# Patient Record
Sex: Male | Born: 1970 | Race: Black or African American | Hispanic: No | Smoking: Former smoker
Health system: Southern US, Community
[De-identification: ages and names within clinical notes are randomized; demographics above are authoritative.]

## PROBLEM LIST (undated history)

## (undated) DIAGNOSIS — G459 Transient cerebral ischemic attack, unspecified: Secondary | ICD-10-CM

## (undated) DIAGNOSIS — E7800 Pure hypercholesterolemia, unspecified: Secondary | ICD-10-CM

## (undated) DIAGNOSIS — I10 Essential (primary) hypertension: Secondary | ICD-10-CM

## (undated) DIAGNOSIS — I517 Cardiomegaly: Secondary | ICD-10-CM

## (undated) DIAGNOSIS — I639 Cerebral infarction, unspecified: Secondary | ICD-10-CM

## (undated) HISTORY — DX: Cerebral infarction, unspecified: I63.9

## (undated) HISTORY — DX: Essential (primary) hypertension: I10

## (undated) HISTORY — PX: NO PAST SURGERIES: SHX2092

## (undated) HISTORY — DX: Cardiomegaly: I51.7

---

## 1998-02-22 ENCOUNTER — Observation Stay (HOSPITAL_COMMUNITY): Admission: EM | Admit: 1998-02-22 | Discharge: 1998-02-22 | Payer: Self-pay | Admitting: Emergency Medicine

## 2000-01-22 ENCOUNTER — Emergency Department (HOSPITAL_COMMUNITY): Admission: EM | Admit: 2000-01-22 | Discharge: 2000-01-22 | Payer: Self-pay | Admitting: Emergency Medicine

## 2002-07-03 ENCOUNTER — Emergency Department (HOSPITAL_COMMUNITY): Admission: EM | Admit: 2002-07-03 | Discharge: 2002-07-04 | Payer: Self-pay | Admitting: Emergency Medicine

## 2002-07-03 ENCOUNTER — Encounter: Payer: Self-pay | Admitting: Emergency Medicine

## 2003-10-22 ENCOUNTER — Emergency Department (HOSPITAL_COMMUNITY): Admission: EM | Admit: 2003-10-22 | Discharge: 2003-10-22 | Payer: Self-pay | Admitting: Emergency Medicine

## 2006-01-08 ENCOUNTER — Emergency Department (HOSPITAL_COMMUNITY): Admission: EM | Admit: 2006-01-08 | Discharge: 2006-01-08 | Payer: Self-pay | Admitting: Emergency Medicine

## 2008-04-08 ENCOUNTER — Emergency Department (HOSPITAL_COMMUNITY): Admission: EM | Admit: 2008-04-08 | Discharge: 2008-04-08 | Payer: Self-pay | Admitting: Emergency Medicine

## 2008-04-11 ENCOUNTER — Ambulatory Visit: Payer: Self-pay | Admitting: Infectious Diseases

## 2008-04-11 ENCOUNTER — Ambulatory Visit: Payer: Self-pay | Admitting: Vascular Surgery

## 2008-04-11 ENCOUNTER — Inpatient Hospital Stay (HOSPITAL_COMMUNITY): Admission: EM | Admit: 2008-04-11 | Discharge: 2008-04-13 | Payer: Self-pay | Admitting: Emergency Medicine

## 2008-04-11 ENCOUNTER — Encounter: Payer: Self-pay | Admitting: Infectious Diseases

## 2008-04-12 DIAGNOSIS — I639 Cerebral infarction, unspecified: Secondary | ICD-10-CM

## 2008-04-12 HISTORY — DX: Cerebral infarction, unspecified: I63.9

## 2008-04-25 ENCOUNTER — Encounter (INDEPENDENT_AMBULATORY_CARE_PROVIDER_SITE_OTHER): Payer: Self-pay | Admitting: Internal Medicine

## 2008-05-02 ENCOUNTER — Encounter (INDEPENDENT_AMBULATORY_CARE_PROVIDER_SITE_OTHER): Payer: Self-pay | Admitting: Internal Medicine

## 2008-05-02 ENCOUNTER — Ambulatory Visit: Payer: Self-pay | Admitting: Internal Medicine

## 2008-05-02 DIAGNOSIS — Z8679 Personal history of other diseases of the circulatory system: Secondary | ICD-10-CM | POA: Insufficient documentation

## 2008-05-02 DIAGNOSIS — I10 Essential (primary) hypertension: Secondary | ICD-10-CM | POA: Insufficient documentation

## 2008-05-04 ENCOUNTER — Ambulatory Visit (HOSPITAL_COMMUNITY): Admission: RE | Admit: 2008-05-04 | Discharge: 2008-05-04 | Payer: Self-pay | Admitting: Cardiovascular Disease

## 2008-05-04 ENCOUNTER — Encounter (INDEPENDENT_AMBULATORY_CARE_PROVIDER_SITE_OTHER): Payer: Self-pay | Admitting: Cardiovascular Disease

## 2008-06-06 ENCOUNTER — Ambulatory Visit: Payer: Self-pay | Admitting: Infectious Diseases

## 2008-06-06 ENCOUNTER — Encounter (INDEPENDENT_AMBULATORY_CARE_PROVIDER_SITE_OTHER): Payer: Self-pay | Admitting: Internal Medicine

## 2008-06-06 LAB — CONVERTED CEMR LAB
Chloride: 106 meq/L (ref 96–112)
Glucose, Bld: 99 mg/dL (ref 70–99)
Potassium: 4.4 meq/L (ref 3.5–5.3)
Sodium: 139 meq/L (ref 135–145)

## 2008-06-20 ENCOUNTER — Encounter (INDEPENDENT_AMBULATORY_CARE_PROVIDER_SITE_OTHER): Payer: Self-pay | Admitting: *Deleted

## 2008-06-20 ENCOUNTER — Ambulatory Visit: Payer: Self-pay | Admitting: Internal Medicine

## 2008-06-20 DIAGNOSIS — F17200 Nicotine dependence, unspecified, uncomplicated: Secondary | ICD-10-CM | POA: Insufficient documentation

## 2008-06-20 DIAGNOSIS — F411 Generalized anxiety disorder: Secondary | ICD-10-CM | POA: Insufficient documentation

## 2008-06-20 LAB — CONVERTED CEMR LAB
CO2: 25 meq/L (ref 19–32)
Calcium: 9.2 mg/dL (ref 8.4–10.5)
Glucose, Bld: 84 mg/dL (ref 70–99)
Sodium: 141 meq/L (ref 135–145)

## 2008-08-15 ENCOUNTER — Telehealth (INDEPENDENT_AMBULATORY_CARE_PROVIDER_SITE_OTHER): Payer: Self-pay | Admitting: Internal Medicine

## 2008-10-09 ENCOUNTER — Telehealth (INDEPENDENT_AMBULATORY_CARE_PROVIDER_SITE_OTHER): Payer: Self-pay | Admitting: Internal Medicine

## 2008-11-05 ENCOUNTER — Telehealth (INDEPENDENT_AMBULATORY_CARE_PROVIDER_SITE_OTHER): Payer: Self-pay | Admitting: Internal Medicine

## 2008-11-09 ENCOUNTER — Ambulatory Visit: Payer: Self-pay | Admitting: Internal Medicine

## 2008-11-09 ENCOUNTER — Ambulatory Visit (HOSPITAL_COMMUNITY): Admission: RE | Admit: 2008-11-09 | Discharge: 2008-11-09 | Payer: Self-pay | Admitting: Internal Medicine

## 2008-11-09 ENCOUNTER — Encounter (INDEPENDENT_AMBULATORY_CARE_PROVIDER_SITE_OTHER): Payer: Self-pay | Admitting: Internal Medicine

## 2008-11-12 LAB — CONVERTED CEMR LAB
AST: 21 units/L (ref 0–37)
Albumin: 4 g/dL (ref 3.5–5.2)
Alkaline Phosphatase: 79 units/L (ref 39–117)
CO2: 27 meq/L (ref 19–32)
Chloride: 106 meq/L (ref 96–112)
Creatinine, Ser: 1.33 mg/dL (ref 0.40–1.50)
GFR calc non Af Amer: 60 mL/min — ABNORMAL LOW (ref >60)
HCT: 42.8 % (ref 39.0–52.0)
HDL: 42 mg/dL (ref >39)
Hemoglobin: 14.9 g/dL (ref 13.0–17.0)
LDL Cholesterol: 134 mg/dL — ABNORMAL HIGH (ref 0–99)
MCV: 87.2 fL (ref 78.0–100.0)
RBC: 4.91 M/uL (ref 4.22–5.81)
Relative Index: 1 (ref 0.0–2.5)
Sodium: 139 meq/L (ref 135–145)
TSH: 1.844 microintl units/mL (ref 0.350–4.500)
Total Bilirubin: 0.6 mg/dL (ref 0.3–1.2)
Total CHOL/HDL Ratio: 4.7
Troponin I: 0.02 ng/mL (ref <0.06)
VLDL: 23 mg/dL (ref 0–40)
WBC: 5.2 10*3/uL (ref 4.0–10.5)

## 2008-11-13 ENCOUNTER — Telehealth (INDEPENDENT_AMBULATORY_CARE_PROVIDER_SITE_OTHER): Payer: Self-pay | Admitting: Internal Medicine

## 2008-11-27 ENCOUNTER — Telehealth (INDEPENDENT_AMBULATORY_CARE_PROVIDER_SITE_OTHER): Payer: Self-pay | Admitting: Internal Medicine

## 2008-12-20 ENCOUNTER — Ambulatory Visit: Payer: Self-pay | Admitting: Internal Medicine

## 2009-02-26 ENCOUNTER — Telehealth: Payer: Self-pay | Admitting: Internal Medicine

## 2009-04-25 ENCOUNTER — Telehealth: Payer: Self-pay | Admitting: Internal Medicine

## 2009-04-30 ENCOUNTER — Ambulatory Visit: Payer: Self-pay | Admitting: Internal Medicine

## 2009-04-30 DIAGNOSIS — E785 Hyperlipidemia, unspecified: Secondary | ICD-10-CM | POA: Insufficient documentation

## 2009-07-09 ENCOUNTER — Telehealth (INDEPENDENT_AMBULATORY_CARE_PROVIDER_SITE_OTHER): Payer: Self-pay | Admitting: *Deleted

## 2009-08-01 ENCOUNTER — Telehealth: Payer: Self-pay | Admitting: Internal Medicine

## 2009-08-28 ENCOUNTER — Telehealth (INDEPENDENT_AMBULATORY_CARE_PROVIDER_SITE_OTHER): Payer: Self-pay | Admitting: *Deleted

## 2009-09-26 ENCOUNTER — Telehealth (INDEPENDENT_AMBULATORY_CARE_PROVIDER_SITE_OTHER): Payer: Self-pay | Admitting: *Deleted

## 2009-10-08 ENCOUNTER — Ambulatory Visit: Payer: Self-pay | Admitting: Infectious Diseases

## 2009-10-19 DIAGNOSIS — D239 Other benign neoplasm of skin, unspecified: Secondary | ICD-10-CM | POA: Insufficient documentation

## 2009-11-22 ENCOUNTER — Emergency Department (HOSPITAL_COMMUNITY): Admission: EM | Admit: 2009-11-22 | Discharge: 2009-11-22 | Payer: Self-pay | Admitting: Emergency Medicine

## 2009-11-22 ENCOUNTER — Encounter: Payer: Self-pay | Admitting: Internal Medicine

## 2009-11-22 DIAGNOSIS — E876 Hypokalemia: Secondary | ICD-10-CM | POA: Insufficient documentation

## 2009-11-25 ENCOUNTER — Telehealth: Payer: Self-pay | Admitting: *Deleted

## 2009-11-25 ENCOUNTER — Ambulatory Visit: Payer: Self-pay | Admitting: Infectious Disease

## 2009-11-25 DIAGNOSIS — R42 Dizziness and giddiness: Secondary | ICD-10-CM

## 2009-11-25 LAB — CONVERTED CEMR LAB
BUN: 19 mg/dL (ref 6–23)
Calcium: 9.7 mg/dL (ref 8.4–10.5)
Chloride: 107 meq/L (ref 96–112)
Creatinine, Ser: 1.22 mg/dL (ref 0.40–1.50)

## 2010-08-04 ENCOUNTER — Telehealth (INDEPENDENT_AMBULATORY_CARE_PROVIDER_SITE_OTHER): Payer: Self-pay | Admitting: *Deleted

## 2010-08-12 NOTE — Progress Notes (Signed)
Summary: med refill/gp  Phone Note Refill Request Message from:  Fax from Pharmacy on August 01, 2009 10:20 AM  Refills Requested: Medication #1:  LISINOPRIL 20 MG TABS Take 2 tablet by mouth once a day   Last Refilled: 07/02/2009 Last appt. 04/30/09; last CMP 11/09/08.   Method Requested: Electronic Initial call taken by: Chinita Pester RN,  August 01, 2009 10:30 AM  Follow-up for Phone Call       Follow-up by: Blanch Media MD,  August 01, 2009 10:39 AM    Prescriptions: LISINOPRIL 20 MG TABS (LISINOPRIL) Take 2 tablet by mouth once a day  #62 x 11   Entered and Authorized by:   Blanch Media MD   Signed by:   Blanch Media MD on 08/01/2009   Method used:   Electronically to        [facility] [phone]* (retail)       [address]       [place], [place]  [address]       Ph: [phone]       Fax: [phone]   RxID:   [id number]

## 2010-08-12 NOTE — Miscellaneous (Signed)
Summary: ED discharge Note  ED Discharge Note  Glenn Newton was seen in the ED on 11/22/2009 at 5 AM in the morning where he prsented with concerns of dizziness. Patient said that his head was spinning as he was trying to get out of bed. This is 2nd episode of dizziness within last 4 weeks. The dizziness was not associated with headache, chest pain, shortness of breath or palpitations. patient has been compliant with his meds. He reports that his BP at home recently has been as low as 101/60's and he feels that it is low for quite some time. In the ED the initial vitals were s/o BP of 93/60 at bedside monitor which came up to 126/78 by the time time he got his 1Lt bolus NS. Patient was asymptomatic when during out interview with him.  Vitals: Temp 98.1 BP 126/77 P 70 RR 18 O2 sat 97% on RA  Gen: AOx3, in no acute distress Eyes: PERRL, EOMI, slight erythema noted b/l ENT:MMM, No erythema noted in posterior pharynx Neck: No JVD, No LAP Chest: CTAB with  good respiratory effort CVS: regular rhythmic rate, NO M/R/G, S1 S2 normal Abdo: soft,ND, BS+x4, Non tender and No hepatosplenomegaly EXT: No odema noted Neuro: Non focal Skin: no rashes noted.   Labs: WBC                                      6.5               4.0-10.5         K/uL  RBC                                      4.74              4.22-5.81        MIL/uL  Hemoglobin (HGB)                         14.5              13.0-17.0        g/dL  Hematocrit (HCT)                         42.3              39.0-52.0        %  MCV                                      89.4              78.0-100.0       fL  MCHC                                     34.3              30.0-36.0        g/dL  RDW  13.6              11.5-15.5        %  Platelet Count (PLT)                     234               150-400          K/uL  Neutrophils, %                           37         l      43-77            %  Lymphocytes, %                            45                12-46            %  Monocytes, %                             11                3-12             %  Eosinophils, %                           5                 0-5              %  Basophils, %                             2          h      0-1              %  Neutrophils, Absolute                    2.4               1.7-7.7          K/uL  Lymphocytes, Absolute                    2.9               0.7-4.0          K/uL  Monocytes, Absolute                      0.7               0.1-1.0          K/uL  Eosinophils, Absolute                    0.4               0.0-0.7          K/uL  Basophils, Absolute                      0.1  0.0-0.1          K/uL Sodium (NA)                              141               135-145          mEq/L  Potassium (K)                            3.1        l      3.5-5.1          mEq/L  Chloride                                 101               96-112           mEq/L  CO2                                      29                19-32            mEq/L  Glucose                                  111        h      70-99            mg/dL  BUN                                      7                 6-23             mg/dL  Creatinine                               1.13              0.4-1.5          mg/dL  GFR, Est Non African American            >60               >60              mL/min  GFR, Est African American                >60               >60              mL/min    Oversized comment, see footnote  1  Bilirubin, Total                         1.0               0.3-1.2          mg/dL  Alkaline Phosphatase                     64                39-117           U/L  SGOT (AST)                               23                0-37             U/L  SGPT (ALT)                               18                0-53             U/L  Total  Protein                           7.9               6.0-8.3          g/dL  Albumin-Blood                             4.3               3.5-5.2          g/dL  Calcium                                  9.5               8.4-10.5         mg/dL  I-STAT-Comment                           SEE NOTE.    Oversized comment, see footnote  1  CKMB, POC                                <1.0       l      1.0-8.0          ng/mL  Troponin I, POC                          <0.05             0.00-0.09        ng/mL  Myoglobin, POC                           67.9              12-200           ng/mL  Imaging CXR IMPRESSION:   No active cardiopulmonary disease.  EKG: Normal sinus rhythm  Plan:  Dizzyness: Given his recent h/o hypotension with his BP being in 90's/60's in ED today, we strongly feel that patient needs to readjust his BP meds. We asked him to stop  taking Norvasc and HCTZ for now. We will arrange him to be seen in the clinic today as an urgent walk in and have his BP meds adjusted.  hypokalemia: This is most likley 2/2 HCTZ, he already recieved total dose of 60 MEq in ED and we may repeat his Bmet while he is in the clinic today. Also we should consider stopping HCTZ in him due to side effects.

## 2010-08-12 NOTE — Progress Notes (Signed)
Summary: Appointment  Phone Note Outgoing Call   Call placed by: Angelina Ok RN,  Nov 25, 2009 9:53 AM Call placed to: Patient Summary of Call: Call to pt to schedule him an appointment in the Clinics this pm.  Call to pt at another number message left for pt to call the Clinics.  Pt has been scheduled for an appointment for this pm to followup with an apppoointment today.Angelina Ok RN  Nov 25, 2009 9:45 AM

## 2010-08-12 NOTE — Assessment & Plan Note (Signed)
Summary: hypertension recheck/gg   Vital Signs:  Patient profile:   40 year old male Height:      65 inches (165.10 cm) Weight:      215.8 pounds (98.09 kg) BMI:     36.04 Temp:     98.9 degrees F (37.17 degrees C) oral Pulse rate:   77 / minute BP sitting:   115 / 79  (right arm)  Vitals Entered By: Stanton Kidney Ditzler RN (October 08, 2009 1:54 PM) Is Patient Diabetic? No Pain Assessment Patient in pain? no      Nutritional Status BMI of > 30 = obese Nutritional Status Detail appetite good  Have you ever been in a relationship where you felt threatened, hurt or afraid?denies   Does patient need assistance? Functional Status Self care Ambulation Normal Comments Ck-up and refills on meds.   Primary Care Provider:  Marinda Elk MD   History of Present Illness: Patient reports good compliance with medications. He did report that he had to wait a week to get simvastatin and norvasc when he called for refills in the past. He also reports that he is still smoking, but is actively trying to quit. He denies any other active compliants.  Notably, he was also concerned about what appears to be a nevus on his nose. He states that there have been no changes in color or irregularity. He asked about the potential for removal given the location of the mole and comments made by other regarding his need to remove it for aesthetic purposes. He comments that the nevus has been the same way for years without any changes.  Depression History:      The patient denies a depressed mood most of the day and a diminished interest in his usual daily activities.         Preventive Screening-Counseling & Management  Alcohol-Tobacco     Smoking Status: current     Smoking Cessation Counseling: yes     Packs/Day: 1/2 ppd     Year Started: STARTED BACK  NOV. 09     Year Quit: Sept. 28, 2009  Caffeine-Diet-Exercise     Does Patient Exercise: no     Type of exercise: WALKING     Times/week:  2-3  Current Problems (verified): 1)  Hyperlipidemia  (ICD-272.4) 2)  Tobacco Abuse  (ICD-305.1) 3)  Anxiety  (ICD-300.00) 4)  Hypertension  (ICD-401.9) 5)  Cerebrovascular Accident, Hx of  (ICD-V12.50)  Current Medications (verified): 1)  Adprin B 325 Mg Tabs (Aspirin Buf(Cacarb-Mgcarb-Mgo)) .... Take 1 Tablet By Mouth Once A Day 2)  Hydrochlorothiazide 25 Mg Tabs (Hydrochlorothiazide) .... Take 1 Tablet By Mouth Once A Day 3)  Coreg 6.25 Mg Tabs (Carvedilol) .... Take 1 Tablet By Mouth Two Times A Day 4)  Simvastatin 40 Mg Tabs (Simvastatin) .... Take 1 Tablet By Mouth Once A Day 5)  Lisinopril 20 Mg Tabs (Lisinopril) .... Take 2 Tablet By Mouth Once A Day 6)  Norvasc 10 Mg Tabs (Amlodipine Besylate) .... 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 Hypertension, severe left ventricular hypertrophy.  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 Married but happily separated 6 kids with 2 from previous relationship and 4 with wife. Smokes about 1 pack per week.  Risk Factors: Exercise: no (10/08/2009)  Risk Factors: Smoking Status: current (10/08/2009) Packs/Day: 1/2 ppd (10/08/2009)  Family History: Reviewed history from 05/02/2008 and no changes required. Family History of Stroke F 1st degree relative <60  Social History: Reviewed history from 04/30/2009 and no changes required. Occupation: Drawing unemployment currently for 2 years. Used to work in [employer]. High school education. Married but  happily separated 6 kids with 2 from previous relationship and 4 with wife. Smokes about 1 pack per week.Packs/Day:  1/2 ppd  Review of Systems      See HPI  Physical Exam  General:  alert, well-developed, well-nourished, and well-hydrated.   Head:  normocephalic and atraumatic.   Eyes:  vision grossly intact.   Ears:  no external deformities.   Nose:  no external deformity, no external erythema, and no nasal discharge. Darkly pigmented nevus noted on the tip of nose on the right side. Circular without irregular edges. Uniform color and consistency of the nevus. Appears to be a common, benign nevus. Mouth:  pharynx pink and moist.   Neck:  supple, full ROM, and no carotid bruits.   Lungs:  normal respiratory effort and normal breath sounds.   Heart:  normal rate, regular rhythm, no murmur, no gallop, and no rub.   Abdomen:  soft and non-tender.   Msk:  normal ROM.   Pulses:  R radial normal and L radial normal.   Neurologic:  alert & oriented X3, cranial nerves II-XII intact, and strength normal in all extremities. Patient reports sensation deficit over 2st digit on R hand which is stable. Skin:  turgor normal, color normal, and no suspicious lesions. Has a well-circumscribed, darkly pigmented nevus on the R side of the distal tp of his nose. See HEENT exam. Psych:  Oriented X3, memory intact for recent and remote, normally interactive, good eye contact, not anxious appearing, and not depressed appearing.     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 On aspirin, lipid lowering medication and several anti-hypertensive medications.  Problem # 2:  TOBACCO ABUSE (ICD-305.1) At 1/2 ppd still. Encouraged cessation, especially given medical history. Being unemployed is making it difficult for the patient to quit.  Problem # 3:  HYPERTENSION (ICD-401.9) BP at goal.  His  updated medication list for this problem includes:    Hydrochlorothiazide 25 Mg Tabs (Hydrochlorothiazide) .Marland Kitchen... Take 1 tablet by mouth once a day    Coreg 6.25 Mg Tabs (Carvedilol) .Marland Kitchen... Take 1 tablet by mouth two times a day    Lisinopril 20 Mg Tabs (Lisinopril) .Marland Kitchen... Take 2 tablet by mouth once a day    Norvasc 10 Mg Tabs (Amlodipine besylate) .Marland Kitchen... Take 1 tablet by mouth once a day  Future Orders: T-Comprehensive Metabolic Panel ([id number]) ... 10/09/2009 T-CBC w/Diff ([id number]) ... 10/09/2009  BP today: 115/79 Prior BP: 137/95 (04/30/2009)  Prior 10 Yr Risk Heart Disease: 11 % (04/30/2009)  Labs Reviewed: K+: 4.3 (11/09/2008) Creat: : 1.33 (11/09/2008)   Chol: 199 (11/09/2008)   HDL: 42 (11/09/2008)   LDL: 134 (11/09/2008)   TG: 113 (11/09/2008)  Problem # 4:  Preventive Health Care (ICD-V70.0) Getting tetanus shot.  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 Given that the removal would be difficult to perform unless a skilled practicioner with experience was the provider, recommended that the patient see a dermatologist to inquire further about removal. Did mention that depending on that practicioner's diagnosis and feeling regarding the need for removal, it may be considered elective and would need to be paid for out of pocket. Patient inquired regarding cost and no accurate estimate could be given. Told patient that a referral to dermatology could be made if he so desired and he deferred referral at this time. Will continue to monitor for any changes that would be concerning and discussed with patient regarding color or size changes and irregularity of the boundaries of the lesion.  Complete Medication List: 1)  Adprin B 325 Mg Tabs (Aspirin buf(cacarb-mgcarb-mgo)) .... Take 1 tablet by mouth once a day 2)  Hydrochlorothiazide 25 Mg Tabs  (Hydrochlorothiazide) .... Take 1 tablet by mouth once a day 3)  Coreg 6.25 Mg Tabs (Carvedilol) .... Take 1 tablet by mouth two times a day 4)  Simvastatin 40 Mg Tabs (Simvastatin) .... Take 1 tablet by mouth once a day 5)  Lisinopril 20 Mg Tabs (Lisinopril) .... Take 2 tablet by mouth once a day 6)  Norvasc 10 Mg Tabs (Amlodipine besylate) .... Take 1 tablet by mouth once a day  Other Orders: Tdap => 17yrs IM ([address]) Admin 1st Vaccine ([address]454) Future Orders: T-Lipid Profile ([id number]) ... 10/09/2009  Patient Instructions: 1)  Please schedule a follow-up appointment in 4 months. 2)  Tobacco is very bad for your health and your loved ones! You Should stop smoking!. 3)  It is important that you exercise regularly at least 20 minutes 5 times a week. If you develop chest pain, have severe difficulty breathing, or feel very tired , stop exercising immediately and seek medical attention. 4)  Take an Aspirin every day. 5)  Please return for a FASTING Lipid Profile one (1) week before your next appointment as scheduled. Prescriptions: NORVASC 10 MG TABS (AMLODIPINE BESYLATE) Take 1 tablet by mouth once a day  #60 x 3   Entered and Authorized by:   Nilda Riggs MD   Signed by:   Nilda Riggs MD on 10/08/2009   Method used:   Electronically to        [facility] [phone]* (retail)       [address]       [place], [place]  [address]       Ph: [phone]       Fax: [phone]   RxID:   [id number] COREG 6.25 MG TABS (CARVEDILOL) Take 1 tablet by mouth two times a day  #120 x 3   Entered and Authorized by:   Nilda Riggs MD   Signed by:   Nilda Riggs MD on 10/08/2009   Method used:   Electronically to        [facility] [phone]* (retail)       [address]       [place], [place]  [address]       Ph: [phone]       Fax: [phone]   RxID:   [id number] HYDROCHLOROTHIAZIDE 25 MG TABS (HYDROCHLOROTHIAZIDE) Take 1 tablet by mouth once a day  #60 x 3    Entered and Authorized by:   Nilda Riggs MD   Signed by:   Nilda Riggs MD on 10/08/2009   Method used:   Electronically to  [facility] [address] [phone]* (retail)       [address]       [place], [place]  [address]       Ph: [phone]       Fax: [phone]   RxID:   [id number]   Prevention & Chronic Care Immunizations   Influenza vaccine: Not documented   Influenza vaccine deferral: Refused  (04/30/2009)    Tetanus booster: 10/08/2009: Tdap    Pneumococcal vaccine: Not documented  Other Screening   Smoking status: current  (10/08/2009)   Smoking cessation counseling: yes  (10/08/2009)  Lipids   Total Cholesterol: 199  (11/09/2008)   LDL: 134  (11/09/2008)   LDL Direct: Not documented   HDL: 42  (11/09/2008)   Triglycerides: 113  (11/09/2008)    SGOT (AST): 21  (11/09/2008)   SGPT (ALT): 17  (11/09/2008) CMP ordered    Alkaline phosphatase: 79  (11/09/2008)   Total bilirubin: 0.6  (11/09/2008)    Lipid flowsheet reviewed?: Yes   Progress toward LDL goal: Unchanged  Hypertension   Last Blood Pressure: 115 / 79  (10/08/2009)   Serum creatinine: 1.33  (11/09/2008)   Serum potassium 4.3  (11/09/2008) CMP ordered     Hypertension flowsheet reviewed?: Yes   Progress toward BP goal: At goal  Self-Management Support :   Personal Goals (by the next clinic visit) :      Personal blood pressure goal: 140/90  (04/30/2009)   Patient will work on the following items until the next clinic visit to reach self-care goals:     Medications and monitoring: take my medicines every day, check my blood pressure, bring all of my medications to every visit, weigh myself weekly  (10/08/2009)     Eating: drink diet soda or water instead of juice or soda, eat more vegetables, use fresh or frozen vegetables, eat foods that are low in salt, eat fruit for snacks and desserts  (10/08/2009)     Activity: take a 30 minute walk every day  (10/08/2009)     Other: wants to "get  back to walking" to decrease weight   (04/30/2009)    Hypertension self-management support: Written self-care plan, Education handout  (10/08/2009)   Hypertension self-care plan printed.   Hypertension education handout printed    Lipid self-management support: Written self-care plan, Education handout  (10/08/2009)   Lipid self-care plan printed.   Lipid education handout printed      Resource handout printed.   Nursing Instructions: Give tetanus booster today   Process Orders Check Orders Results:     [facility]: ABN not required for this insurance Tests Sent for requisitioning (October 19, 2009 4:35 PM):     10/09/2009: [facility] -- T-Comprehensive Metabolic Panel [[id number]] (signed)     10/09/2009: [facility] -- T-Lipid Profile [id number] (signed)     10/09/2009: [facility] -- [facility] w/Diff [[id number]] (signed)     Immunizations Administered:  Tetanus Vaccine:    Vaccine Type: Tdap    Site: right deltoid    Mfr: Boostrix    Dose: 0.5 ml    Route: IM    Given by: Dorie Rank RN    Exp. Date: 10/05/2011    Lot #: [id number]    VIS given: 05/31/07 version given October 08, 2009.

## 2010-08-12 NOTE — Progress Notes (Signed)
Summary: med refill/gp  Phone Note Refill Request Message from:  Fax from Pharmacy on September 26, 2009 11:20 AM  Refills Requested: Medication #1:  NORVASC 10 MG TABS Take 1 tablet by mouth once a day.   Last Refilled: 08/28/2009 Last appt. 04/30/09.   Last CMP 11/09/08.   Method Requested: Electronic Initial call taken by: Chinita Pester RN,  September 26, 2009 11:20 AM  Follow-up for Phone Call        At last visit, Dr Logan Bores requested that pt return in one month.  Will refill 2 months worth and make pt F/U appt so can recheck BP and BMP as was last checked in April 2010. Follow-up by: Blanch Media MD,  September 30, 2009 9:34 AM  Additional Follow-up for Phone Call Additional follow up Details #1::        Flag sent to [facility] for an appt. Additional Follow-up by: Chinita Pester RN,  September 30, 2009 3:12 PM    Prescriptions: NORVASC 10 MG TABS (AMLODIPINE BESYLATE) Take 1 tablet by mouth once a day  #30 x 1   Entered and Authorized by:   Blanch Media MD   Signed by:   Blanch Media MD on 09/30/2009   Method used:   Electronically to        [facility] [phone]* (retail)       [address]       [place], [place]  [address]       Ph: [phone]       Fax: [phone]   RxID:   [id number]

## 2010-08-12 NOTE — Progress Notes (Signed)
Summary: med refill/gp  Phone Note Refill Request Message from:  Fax from Pharmacy on August 28, 2009 2:01 PM  Refills Requested: Medication #1:  SIMVASTATIN 40 MG TABS Take 1 tablet by mouth once a day   Last Refilled: 07/25/2009 Last appt. 04/30/09; last CMP/lipid 05/01/09.   Method Requested: Electronic Initial call taken by: Chinita Pester RN,  August 28, 2009 2:01 PM    Prescriptions: SIMVASTATIN 40 MG TABS (SIMVASTATIN) Take 1 tablet by mouth once a day  #30 x 6   Entered and Authorized by:   Zoila Shutter MD   Signed by:   Zoila Shutter MD on 08/28/2009   Method used:   Electronically to        [facility] [phone]* (retail)       [address]       [place], [place]  [address]       Ph: [phone]       Fax: [phone]   RxID:   [id number]

## 2010-08-12 NOTE — Assessment & Plan Note (Signed)
Summary: ACUTE-ADD AGAIN PER DR Eben Burow FOR F/U/CFB   Vital Signs:  Patient profile:   40 year old male Height:      65 inches (165.10 cm) Weight:      216.0 pounds (98.18 kg) BMI:     36.07 Temp:     99.0 degrees F (37.22 degrees C) oral Pulse rate:   92 / minute BP sitting:   140 / 97  (left arm) BP standing:   132 / 92  (left arm)  Vitals Entered By: Stanton Kidney Ditzler RN (Nov 25, 2009 3:19 PM)  Is Patient Diabetic? No Pain Assessment Patient in pain? no      Nutritional Status BMI of > 30 = obese Nutritional Status Detail appetite good  Have you ever been in a relationship where you felt threatened, hurt or afraid?denies   Does patient need assistance? Functional Status Self care Ambulation Normal Comments FU from ER - stopped Norvasc about 4 days ago.   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 Hypertension, severe left ventricular hypertrophy.   He went to ED complaining of dizziness. They stop norvasc. They thought that his blood pressure was low, he recieved 1 Lt NS that improved symptoms and hypotension in the ED.  He has not been taking norvasc. He ran out carvedilol, he was not taking carvedilol whe he was seen in the ED. He relates that dizziness has resolved. No more chest pain. He relates chest pain was very mild 1/10. He had Cath 2009, that showed normal coronary arthery. He denies chest pain or dyspnea on exertion.   Depression History:      The patient denies a depressed mood most of the day and a diminished interest in his usual daily activities.         Preventive Screening-Counseling & Management  Alcohol-Tobacco     Smoking Status: current     Smoking Cessation Counseling: yes     Packs/Day: 1/2 ppd     Year  Started: STARTED BACK  NOV. 09     Year Quit: Sept. 28, 2009  Caffeine-Diet-Exercise     Does Patient Exercise: no     Type of exercise: WALKING     Times/week: 2-3  Current Medications (verified): 1)  Adprin B 325 Mg Tabs (Aspirin Buf(Cacarb-Mgcarb-Mgo)) .... Take 1 Tablet By Mouth Once A Day 2)  Hydrochlorothiazide 25 Mg Tabs (Hydrochlorothiazide) .... Take 1 Tablet By Mouth Once A Day 3)  Coreg 6.25 Mg Tabs (Carvedilol) .... Take 1 Tablet By Mouth Two Times A Day 4)  Simvastatin 40 Mg Tabs (Simvastatin) .... Take 1 Tablet By Mouth Once A Day 5)  Lisinopril 20 Mg Tabs (Lisinopril) .... Take 2 Tablet By Mouth Once A Day 6)  Norvasc 10 Mg Tabs (Amlodipine Besylate) .... Take 1 Tablet By Mouth Once A Day  Allergies: No Known Drug Allergies  Review of Systems  The patient denies fever, chest pain, syncope, dyspnea on exertion, peripheral edema, prolonged cough, headaches, hemoptysis, abdominal pain, melena, and hematochezia.    Physical Exam  General:  alert, well-developed, and well-nourished.   Head:  normocephalic and atraumatic.   Lungs:  normal respiratory effort, no intercostal retractions, no accessory muscle use, and normal breath sounds.   Heart:  normal rate and regular rhythm.   Abdomen:  soft, non-tender, normal bowel sounds, and no distention.   Extremities:  No edema.  Neurologic:  alert & oriented X3, cranial nerves II-XII intact, and strength normal in all extremities.     Impression & Recommendations:  Problem # 1:  HYPERTENSION (ICD-401.9) He was seen in the ED with low blood pressure and dizziness. I will continue to hold norvasc. I will give refill and restart coreg. He will benefit from coreg more due to his history of cardiomyopathy. Patient was advised to let us know if dizziness ocours again.  The following medications were removed from the medication list:    Norvasc 10 Mg Tabs (Amlodipine besylate) .Marland Kitchen... Take 1 tablet by mouth once a day His updated  medication list for this problem includes:    Hydrochlorothiazide 25 Mg Tabs (Hydrochlorothiazide) .Marland Kitchen... Take 1 tablet by mouth once a day    Coreg 6.25 Mg Tabs (Carvedilol) .Marland Kitchen... Take 1 tablet by mouth two times a day    Lisinopril 20 Mg Tabs (Lisinopril) .Marland Kitchen... Take 2 tablet by mouth once a day  BP today: 140/97 Prior BP: 115/79 (10/08/2009)  Prior 10 Yr Risk Heart Disease: 11 % (04/30/2009)  Labs Reviewed: K+: 4.3 (11/09/2008) Creat: : 1.33 (11/09/2008)   Chol: 199 (11/09/2008)   HDL: 42 (11/09/2008)   LDL: 134 (11/09/2008)   TG: 113 (11/09/2008)  Problem # 2:  HYPOKALEMIA (ICD-276.8) I will check Bmet. Replete as needed.  Orders: T-Basic Metabolic Panel [id number])  Problem # 3:  DIZZINESS (ICD-780.4) This was likely due to hypotension. If this happen again , he might need to have another 2 DECHO due to his history of cardiomyopathy. He relates that dizziness has resolved.   Complete Medication List: 1)  Adprin B 325 Mg Tabs (Aspirin buf(cacarb-mgcarb-mgo)) .... Take 1 tablet by mouth once a day 2)  Hydrochlorothiazide 25 Mg Tabs (Hydrochlorothiazide) .... Take 1 tablet by mouth once a day 3)  Coreg 6.25 Mg Tabs (Carvedilol) .... Take 1 tablet by mouth two times a day 4)  Simvastatin 40 Mg Tabs (Simvastatin) .... Take 1 tablet by mouth once a day 5)  Lisinopril 20 Mg Tabs (Lisinopril) .... Take 2 tablet by mouth once a day  Patient Instructions: 1)  Please schedule a follow-up appointment in 1 month. 2)  Stop taking norvasc.  Prescriptions: COREG 6.25 MG TABS (CARVEDILOL) Take 1 tablet by mouth two times a day  #120 x 3   Entered and Authorized by:   Hartley Barefoot MD   Signed by:   Hartley Barefoot MD on 11/25/2009   Method used:   Electronically to        [facility] [phone]* (retail)       [address]       [place], [place]  [address]       Ph: [phone]       Fax: [phone]   RxID:   [id number]   Prevention & Chronic Care Immunizations    Influenza vaccine: Not documented   Influenza vaccine deferral: Refused  (04/30/2009)    Tetanus booster: 10/08/2009: Tdap    Pneumococcal vaccine: Not documented  Other Screening   Smoking status: current  (11/25/2009)   Smoking cessation counseling: yes  (11/25/2009)  Lipids   Total Cholesterol: 199  (11/09/2008)   LDL: 134  (11/09/2008)   LDL Direct: Not documented   HDL: 42  (11/09/2008)   Triglycerides: 113  (11/09/2008)    SGOT (AST): 21  (11/09/2008)   SGPT (ALT): 17  (11/09/2008)  Alkaline phosphatase: 79  (11/09/2008)   Total bilirubin: 0.6  (11/09/2008)  Hypertension   Last Blood Pressure: 140 / 97  (11/25/2009)   Serum creatinine: 1.33  (11/09/2008)   Serum potassium 4.3  (11/09/2008)  Self-Management Support :   Personal Goals (by the next clinic visit) :      Personal blood pressure goal: 140/90  (04/30/2009)     Personal LDL goal: 100  (11/25/2009)    Patient will work on the following items until the next clinic visit to reach self-care goals:     Medications and monitoring: take my medicines every day, check my blood pressure, bring all of my medications to every visit  (11/25/2009)     Eating: drink diet soda or water instead of juice or soda, eat more vegetables, use fresh or frozen vegetables, eat foods that are low in salt, eat fruit for snacks and desserts  (11/25/2009)     Activity: take a 30 minute walk every day  (11/25/2009)     Other: wants to "get back to walking" to decrease weight   (04/30/2009)    Hypertension self-management support: Written self-care plan, Education handout, Resources for patients handout  (11/25/2009)   Hypertension self-care plan printed.   Hypertension education handout printed    Lipid self-management support: Written self-care plan, Education handout, Resources for patients handout  (11/25/2009)   Lipid self-care plan printed.   Lipid education handout printed      Resource handout printed.  Process  Orders Check Orders Results:     [facility]: ABN not required for this insurance Tests Sent for requisitioning (Nov 26, 2009 8:43 AM):     11/25/2009: [facility] -- T-Basic Metabolic Panel [phone] (signed)    Process Orders Check Orders Results:     [facility]: ABN not required for this insurance Tests Sent for requisitioning (Nov 26, 2009 8:43 AM):     11/25/2009: [facility] -- T-Basic Metabolic Panel [phone] (signed)

## 2010-08-14 NOTE — Progress Notes (Signed)
Summary: Refill/gh  Phone Note Refill Request Message from:  Fax from Pharmacy on August 04, 2010 11:09 AM  Refills Requested: Medication #1:  ADPRIN B 325 MG TABS Take 1 tablet by mouth once a day  Medication #2:  HYDROCHLOROTHIAZIDE 25 MG TABS Take 1 tablet by mouth once a day  Medication #3:  COREG 6.25 MG TABS Take 1 tablet by mouth two times a day  Medication #4:  SIMVASTATIN 40 MG TABS Take 1 tablet by mouth once a day Last visit and labs 11/25/2009.   Method Requested: Electronic Initial call taken by: Angelina Ok RN,  August 04, 2010 11:18 AM    Prescriptions: LISINOPRIL 20 MG TABS (LISINOPRIL) Take 2 tablet by mouth once a day  #62 x 6   Entered and Authorized by:   Donia Guiles MD   Signed by:   Donia Guiles MD on 08/04/2010   Method used:   Electronically to        [facility] [phone]* (retail)       [address]       [place], [place]  [address]       Ph: [phone]       Fax: [phone]   RxID:   [id number] SIMVASTATIN 40 MG TABS (SIMVASTATIN) Take 1 tablet by mouth once a day  #30 x 6   Entered and Authorized by:   Donia Guiles MD   Signed by:   Donia Guiles MD on 08/04/2010   Method used:   Electronically to        [facility] [phone]* (retail)       [address]       [place], [place]  [address]       Ph: [phone]       Fax: [phone]   RxID:   [id number] COREG 6.25 MG TABS (CARVEDILOL) Take 1 tablet by mouth two times a day  #120 x 6   Entered and Authorized by:   Donia Guiles MD   Signed by:   Donia Guiles MD on 08/04/2010   Method used:   Electronically to        [facility] [phone]* (retail)       [address]       [place], [place]  [address]       Ph: [phone]       Fax: [phone]   RxID:   [id number] HYDROCHLOROTHIAZIDE 25 MG TABS (HYDROCHLOROTHIAZIDE) Take 1 tablet by mouth once a day  #60 x 6   Entered and Authorized by:   Donia Guiles MD   Signed by:   Donia Guiles MD on 08/04/2010  Method used:   Electronically to        [facility] [phone]* (retail)       [address]       [place], [place]  [address]       Ph: [phone]       Fax: [phone]   RxID:   [id number] ADPRIN B 325 MG TABS (ASPIRIN BUF(CACARB-MGCARB-MGO)) Take 1 tablet by mouth once a day  #31 x 6   Entered and Authorized by:   Donia Guiles MD   Signed by:   Donia Guiles MD on 08/04/2010   Method used:   Electronically to        [facility] [phone]* (retail)       [address]       [place], [place]  [address]  Ph: [phone]       Fax: [phone]   RxID:   [id number]

## 2010-08-14 NOTE — Progress Notes (Signed)
Summary: Refill/gh  Phone Note Refill Request Message from:  Patient on August 04, 2010 11:20 AM  Refills Requested: Medication #1:  LISINOPRIL 20 MG TABS Take 2 tablet by mouth once a day.  Method Requested: Electronic    Prescriptions: LISINOPRIL 20 MG TABS (LISINOPRIL) Take 2 tablet by mouth once a day  #62 x 6   Entered and Authorized by:   Donia Guiles MD   Signed by:   Donia Guiles MD on 08/04/2010   Method used:   Electronically to        [facility] [phone]* (retail)       [address]       [place], [place]  [address]       Ph: [phone]       Fax: [phone]   RxID:   [id number]

## 2010-08-16 ENCOUNTER — Other Ambulatory Visit: Payer: Self-pay | Admitting: *Deleted

## 2010-08-16 NOTE — Telephone Encounter (Signed)
Refills have been done recently

## 2010-09-01 ENCOUNTER — Encounter: Payer: Self-pay | Admitting: Internal Medicine

## 2010-09-01 ENCOUNTER — Ambulatory Visit (INDEPENDENT_AMBULATORY_CARE_PROVIDER_SITE_OTHER): Payer: Self-pay | Admitting: Internal Medicine

## 2010-09-01 VITALS — BP 142/94 | HR 75 | Temp 99.0°F | Ht 65.0 in | Wt 191.6 lb

## 2010-09-01 DIAGNOSIS — E785 Hyperlipidemia, unspecified: Secondary | ICD-10-CM

## 2010-09-01 DIAGNOSIS — Z8679 Personal history of other diseases of the circulatory system: Secondary | ICD-10-CM

## 2010-09-01 DIAGNOSIS — I10 Essential (primary) hypertension: Secondary | ICD-10-CM

## 2010-09-01 LAB — BASIC METABOLIC PANEL
BUN: 15 mg/dL (ref 6–23)
CO2: 25 mEq/L (ref 19–32)
Chloride: 103 mEq/L (ref 96–112)
Glucose, Bld: 85 mg/dL (ref 70–99)
Potassium: 4.4 mEq/L (ref 3.5–5.3)

## 2010-09-01 MED ORDER — CARVEDILOL 25 MG PO TABS: 25.0000 mg | ORAL_TABLET | Freq: Two times a day (BID) | ORAL | Status: DC

## 2010-09-01 MED ORDER — SIMVASTATIN 40 MG PO TABS: 40.0000 mg | ORAL_TABLET | Freq: Every day | ORAL | Status: DC

## 2010-09-01 MED ORDER — HYDROCHLOROTHIAZIDE 25 MG PO TABS: 25.0000 mg | ORAL_TABLET | Freq: Every day | ORAL | Status: DC

## 2010-09-01 NOTE — Progress Notes (Signed)
  Subjective:    Patient ID: Glenn Newton, male    DOB: 1970-07-21, 40 y.o.   MRN: [id number]  HPI [name] is a very pleasant 40 year old man with past medical history of stroke here today for a regular followup.    patient is complaining of headache 1-2/10 , present on the left side of the head , no aggravating or relieving factors , no radiation , no associated symptoms , lasts for as long as one to 2 days sometimes. Patient has been complaining of headaches more often since stopping the lisinopril. Patient also hasn't been taking his statin for last 4-5 months    Mr. Mcenery's blood pressure is elevated at 142/94 today his pulse rate is 75. He is currently on Coreg 6.25 mg twice a day and hydrochlorothiazide for blood pressure control. I would adjust his blood pressure medications today to optimize his blood pressure.    patient's cholesterol has not been checked in last 1-1/2 years I have put in a  Future order for October 10, 2010 to come fasting for a repeat lipid profile. I would also start the patient on Zocor 40 mg daily.   no other concerns at this time. Next  Patient has started a new job and the is motivated to control his blood pressure and decrease his weight.    Review of Systems  Constitutional: Negative.   HENT: Negative.   Eyes: Negative.   Respiratory: Negative.  Negative for cough, choking and chest tightness.   Cardiovascular: Negative.  Negative for chest pain and palpitations.  Gastrointestinal: Negative.   Genitourinary: Negative.  Negative for dysuria and difficulty urinating.  Musculoskeletal: Negative.   Neurological: Positive for numbness.  Hematological: Negative.   Psychiatric/Behavioral: Negative.        Objective:   Physical Exam  Constitutional: He is oriented to person, place, and time. He appears well-developed and well-nourished.  HENT:  Head: Normocephalic and atraumatic.  Eyes: Conjunctivae and EOM are normal. Pupils are equal, round, and  reactive to light.  Neck: Normal range of motion. Neck supple.  Cardiovascular: Normal rate, regular rhythm, normal heart sounds and intact distal pulses.  Exam reveals no gallop and no friction rub.   No murmur heard. Pulmonary/Chest: Effort normal and breath sounds normal. No respiratory distress. He has no wheezes. He has no rales. He exhibits no tenderness.  Abdominal: Soft. Bowel sounds are normal. He exhibits no distension and no mass. There is no tenderness. There is no rebound and no guarding.  Musculoskeletal: He exhibits no edema and no tenderness.  Neurological: He is alert and oriented to person, place, and time. He has normal reflexes.  Skin: Skin is warm.          Assessment & Plan:

## 2010-09-01 NOTE — Patient Instructions (Signed)
Hypertension (High Blood Pressure)   As your heart beats, it forces blood through your arteries. This force is your blood pressure. If the pressure is too high, it is called hypertension (HTN) or high blood pressure. HTN is dangerous because you may have it and not know it. High blood pressure may mean that your heart has to work harder to pump blood. Your arteries may be narrow or stiff. The extra work puts you at risk for heart disease, stroke, and other problems.   Blood pressure consists of two numbers, a higher number over a lower, 110/72, for example. It is stated as “110 over 72.” The ideal is below 120 for the top number (systolic) and under 80 for the bottom (diastolic).   You should pay close attention to your blood pressure if you have certain conditions such as:   Ø Heart failure.   Ø Prior heart attack.   Ø Diabetes Ø Chronic kidney disease.   Ø Prior stroke.   Ø Multiple risk factors for heart disease.       To see if you have HTN, your blood pressure should be measured while you are seated with your arm held at the level of the heart. It should be measured at least twice. A one-time elevated blood pressure reading (especially in the Emergency Department) does not mean that you need treatment. There may be conditions in which the blood pressure is different between your right and left arms. It is important to see your caregiver soon for a recheck.   Most people have essential hypertension which means that there is not a specific cause. This type of high blood pressure may be lowered by changing lifestyle factors such as:   Ø Stress.   Ø Smoking.   Ø Lack of exercise. Ø Excessive weight.   Ø Drug/tobacco/alcohol use.   Ø Eating less salt.    Most people do not have symptoms from high blood pressure until it has caused damage to the body. Effective treatment can often prevent, delay or reduce that damage.   TREATMENT:   Treatment for high blood pressure, when a cause has been identified, is directed at  the cause. There are a large number of medications to treat HTN. These fall into several categories, and your caregiver will help you select the medicines that are best for you. Medications may have side effects. You should review side effects with your caregiver.   If your blood pressure stays high after you have made lifestyle changes or started on medicines,   Ø Your medication(s) may need to be changed.   Ø Other problems may need to be addressed.   Ø Be certain you understand your prescriptions, and know how and when to take your medicine.   Ø Be sure to follow up with your caregiver within the time frame advised (usually within two weeks) to have your blood pressure rechecked and to review your medications.   Ø If you are taking more than one medicine to lower your blood pressure, make sure you know how and at what times they should be taken. Taking two medicines at the same time can result in blood pressure that is too low.   SEEK IMMEDIATE MEDICAL CARE IF YOU DEVELOP:   Ø A severe headache, blurred or changing vision, or confusion.   Ø Unusual weakness or numbness, or a faint feeling.   Ø Severe chest or abdominal pain, vomiting, or breathing problems.   It is your responsibility to follow   up with your caregiver in order to determine if your elevated blood pressure should be treated.   MAKE SURE YOU:   Ø Understand these instructions.   Ø Will watch your condition.   Ø Will get help right away if you are not doing well or get worse.   Document Released: 12/10/2005 Document Re-Released: 09/25/2008   [employer]® Patient Information ©2011 [employer].

## 2010-09-01 NOTE — Assessment & Plan Note (Signed)
Patient still has some numbness and paresthesias in his right index finger and right thumb. Patient has completely resolved right-sided weakness other than what mentioned in the note. Patient is very anxious that he might have another stroke as his father died of stroke at age 40. I reassured him that if he continues to take his medications as prescribed we may definitely delay another stroke.

## 2010-09-01 NOTE — Assessment & Plan Note (Signed)
Patient's most recent LDL was 1:30 4 in January 1610. I would restart the patient on Zocor today and would get him back on October 10, 2010 for a repeat lipid profile. Ideally patient's LDL should be less than 100.

## 2010-09-01 NOTE — Assessment & Plan Note (Signed)
Patient's blood pressure is elevated today 142/94 patient states that this has been the trend because he has a blood pressure machine at home and usually checks it. I repeated the blood pressure which again came out to be 142/96. I would increase patient's Coreg from 6.25 mg twice a day to 25 mg twice a day. Patient's heart rate is 75 per minute and I think it should be reasonable to increase Coreg. Patient was told about the change in medication and was asked to contact the clinic if he experiences any problems. Patient was also called for a follow up appointment in 6 months.

## 2010-09-30 LAB — CBC
HCT: 42.3 % (ref 39.0–52.0)
MCHC: 34.3 g/dL (ref 30.0–36.0)
MCV: 89.4 fL (ref 78.0–100.0)
Platelets: 234 10*3/uL (ref 150–400)
RDW: 13.6 % (ref 11.5–15.5)

## 2010-09-30 LAB — POCT CARDIAC MARKERS: Troponin i, poc: <0.05 ng/mL (ref 0.00–0.09)

## 2010-09-30 LAB — COMPREHENSIVE METABOLIC PANEL
AST: 23 U/L (ref 0–37)
Albumin: 4.3 g/dL (ref 3.5–5.2)
BUN: 7 mg/dL (ref 6–23)
Calcium: 9.5 mg/dL (ref 8.4–10.5)
Creatinine, Ser: 1.13 mg/dL (ref 0.4–1.5)
GFR calc Af Amer: >60 mL/min (ref >60)

## 2010-09-30 LAB — DIFFERENTIAL
Basophils Relative: 2 % — ABNORMAL HIGH (ref 0–1)
Eosinophils Absolute: 0.4 10*3/uL (ref 0.0–0.7)
Lymphs Abs: 2.9 10*3/uL (ref 0.7–4.0)
Neutrophils Relative %: 37 % — ABNORMAL LOW (ref 43–77)

## 2010-09-30 LAB — APTT: aPTT: 25 seconds (ref 24–37)

## 2010-10-02 ENCOUNTER — Encounter: Payer: Self-pay | Admitting: Internal Medicine

## 2010-10-10 ENCOUNTER — Other Ambulatory Visit: Payer: Self-pay

## 2010-11-25 NOTE — Consult Note (Signed)
NAME:  Glenn Newton                  ACCOUNT NO.:  [id number]   MEDICAL RECORD NO.:  [id number]          PATIENT TYPE:  INP   LOCATION:  4703                         FACILITY:  [facility]   PHYSICIAN:  Deanna Artis. Hickling, M.D.DATE OF BIRTH:  October 07, 1970   DATE OF CONSULTATION:  04/13/2008  DATE OF DISCHARGE:  04/13/2008                                 CONSULTATION   CHIEF COMPLAINT:  Right-sided numbness.   HISTORY OF PRESENT CONDITION:  Glenn Newton is a 40 year old African  American gentleman with a history of hypertension, chronic tobacco  abuse, left ventricular hypertrophy with mild outlet obstruction in-  septal thickening, and dyslipidemia.   The patient had a 2-week history of intermittent right-sided numbness  and had visits to the emergency room with this complaint and was noted  to have severe hypertension.  He was treated with antihypertensives and  seemed to have remission of his symptoms only to have them recur.  As  result of this, the patient was admitted on September 30.  A CT scan of  the brain was unremarkable.  MRI; however, showed evidence of a subacute  stroke in the left thalamus and edge of the posterior limb of the  internal capsule and also in the posterior frontal/parietal centrum  semiovale.  In this setting, we are asked to see the patient to  determine etiology of his dysfunction and to make recommendations for  further workup and treatment.   CT scan of the brain showed subcortical Ballweg matter changes suggesting  a prior lacunar infarction in the right basal ganglia.  This was not  seen on MRI scan.  An EKG showed T-wave inversions V5 and V6, also V2  and V3 and also ST changes at V2-V4.  Unless, the patient did not show  definite ischemia.  Carotid Doppler showed no hemodynamically  significant stenosis.  He had bilateral vertebral antegrade flow and a  left vertebral thumping signal in the proximal side that was consistent  with a more distal occlusion.  A  2-D echocardiogram has been completed.  The result is not new so far as septal hypertrophy.  Apparently, the  patient also had a cardiac cath, no source of emboli was found.   The patient has normal coronary arteries.  His urine drug screen was  negative.  RPR and HIV were negative.  Hemoglobin A1c 5.9.  His lipids,  however showed LDL 177 and total cholesterol of 244.  He was started on  pravastatin.   The patient's blood pressure has been present for at least 15 years and  uncontrolled.  The paresthesias in the right body are consistent with  his left thalamic stroke.   PAST MEDICAL HISTORY:  As noted above.  He is unemployed and has not had  regular medical care.   REVIEW OF SYSTEMS:  A 12-system of review is negative, except as noted  above in the history present illness and also increased urinary  frequency and right posterior headache.   FAMILY HISTORY:  Positive for death of his father at age 17.  The chart  says 44.  He had a stroke at age 88 and died as a result of stroke at  age 66.  He also had myocardial infarction.  He has a half sister with  diabetes and hypertension.  Mother with diabetes and hypertension.   SOCIAL HISTORY:  The patient smokes 1-1/2 to 2 packs per day for 17  years.  He drinks a 12 pack of beer on weekends.  He is unemployed.  He  is separated from his wife and uninsured.   PHYSICAL EXAMINATION:  GENERAL:  On examination today, this is a  pleasant gentleman in no acute distress, who looks at his stated age.  VITAL SIGNS:  Temperature 98.6, blood pressure 167/109 (it has been  lower during other times during the hospital admission), pulse 72,  respirations 22, and oxygen saturation 100%.  EARS, NOSE, AND THROAT:  No infections, bruits, or meningismus.  LUNGS:  Clear.  HEART:  No murmurs.  Pulses are normal.  ABDOMEN:  Soft.  Bowel sounds normal.  No hepatosplenomegaly.  EXTREMITIES:  Normal.  NEUROLOGICAL:  Awake, alert without dysphasia, and  dyspraxia.  Cranial  nerves: Round and reactive pupils.  Visual fields full.  Extraocular  movements full.  Symmetric facial strength midline tongue.  Air  conduction greater than bone conduction bilaterally.  Motor examination:  The patient has normal strength, tone, and mass.  Good fine motor  movements.  No drift.  Sensation, subjective to numbness on the right  face, arm, trunk and leg, mouth, finger, and toes.  He complains of  paresthesias.  I am not able to discern a difference except that this  has a difference in sensing the coldness on the tuning fork in the  second and third distributions of the trigeminal nerve.  He has good  proprioception, vibration, and stereoagnosis.  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  Cerebellar, good finger-to-nose and  rapid alternative movements.  Gait normal.  Normal heel-to-toe and  tandem.  He says he feels as his right leg is weak, but he does not show  it.  Deep tendon reflexes are absent.  The patient had bilateral flexor  plantar responses.   IMPRESSION:  1. Bilateral subcentimeter strokes.  The left side is symptomatic.  I      do not think this represents a cardioembolic phenomenon, but I      certainly cannot rule it out.  It is quite coincidental to see      subacute strokes in the same age in different vascular      distributions, which would suggest a cardioembolic source. (434.01)  2. Uncontrolled hypertension. (404.11)  3. Left ventricular hypertrophy.  4. Tobacco and alcohol abuse.   PLAN:  1. Discharge the patient on aspirin, hydrochlorothiazide, and      pravastatin.  2. Stop smoking and associating with friends who are smokers.  I      counseled this with the patient and I also counseled about      medication compliance and compliance with the appointment and told      the patient that because of his young age and the risk factors that      he has as well as his family history, that the history will  repeat      itself as it did with his father if he does not change.  I think      that he understands and he voiced understanding and plans to stop  smoking, take his medications.  Medications have been chosen wisely      to reflect 4 hours or  less per prescription.  I appreciate the      opportunity to see him.  We will be happy to seem in followup if      the need arise.      Deanna Artis. Sharene Skeans, M.D.  Electronically Signed     [facility]/MED[facility]  D:  04/13/2008  T:  04/14/2008  Job:  [id number]   cc:   Fransisco Hertz, M.D.

## 2010-11-28 NOTE — Discharge Summary (Signed)
NAME:  Glenn Newton, Glenn Newton                  ACCOUNT NO.:  [id number]   MEDICAL RECORD NO.:  [id number]          PATIENT TYPE:  INP   LOCATION:  4703                         FACILITY:  [facility]   PHYSICIAN:  Chauncey Reading, D.O.  DATE OF BIRTH:  09/14/1970   DATE OF ADMISSION:  04/11/2008  DATE OF DISCHARGE:  04/13/2008                               DISCHARGE SUMMARY   DISCHARGE DIAGNOSES:  1. Ischemic cerebrovascular accident x2 (left thalamus/posterior limb      internal capsule; right parietal subcortical Bohannon matter).  2. Right lower arm and lower leg, foot, left thumb paresthesias      secondary to left thalamic cerebrovascular accident.  3. 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  4. Chronic hypertension with significant left ventricular hypertrophy      with mild outflow tract obstruction.  5. Tobacco abuse x40-pack years.  6. Alcohol abuse.   DISCHARGE MEDICATIONS:  1. Aspirin 325 mg p.o. daily.  2. Diltiazem 120 mg p.o. 4 times days on empty stomach.  3. Carvedilol 6.25 mg p.o. b.i.d.  4. Pravastatin 40 mg p.o. daily.  5. Lisinopril 10 mg p.o. daily.   Consultant:  Dr. Sharene Skeans   DISPOSITION AND FOLLOWUP:  At the time of discharge, the patient  reported that his right-sided paresthesias are not subsided, and he  understood that they were due to stroke and that he may or may not  regain normal sensation.  He was discharged with a prescription for a  calcium channel blocker, beta-blocker, and ACE inhibitor as well as a  statin all are on the [employer] 4 dollar list.  We will call Dr.  Algie Coffer to set up a followup in 2 weeks.  Discharge form was faxed to  Dr. Roseanne Kaufman office.  He will also follow up at our Outpatient Clinic  with Dr. Radonna Ricker on April 12, 2008 to establish care and assess blood  pressure control and smoking and drinking cessation.  The patient was  advised to rejoin AA and purchase Nicoderm CQ patches.  He expressed a  lot of  fear regarding his diagnoses and he is confident he will follow  our advice.   PROCEDURES PERFORMED:  1. A 2-D echo, significant for marked left ventricular hypertrophy      with mild outflow tract obstruction.  2. Carotid Doppler ultrasound showed no internal carotid artery      stenosis, but left vetebral thumping approximately consistent with      more distal occlusion.  3. CT head without contrast.  Negative for acute hemorrhage, low      density areas with subcortical Bridgers matter that may represent      chronic changes, there may also be a lacunar infarct in the right      basal ganglia of unknown age.  4. MRA without contrast.  Diffuse intracranial medium-sized vessel      atherosclerosis, focal stenosis of the left A1 segmented,      considerable atherosclerotic irregularity of the basal artery, but      without a prominent  stenosis.  5. MRI without contrast.  acute infarction Thalamus/posterior limb      internal capsule; right parietal subcortical Rabold matter, the      larger in the left thalamus/posterior limb internal capsule and the      small in the right parietal subcortical Friedlander matter; chronic small      vessel changes all seen throughout the brain, markedly premature      for age.  6. Heart catheterization shows normal coronary artery.   CONSULTATIONS:  1. Ricki Rodriguez, MD, cardiologist with [facility]  2. Deanna Artis. Sharene Skeans, MD, of [facility] were      consulted for this.   HISTORY OF PRESENT ILLNESS:  A 40 year old African American male with 18  plus year history of untreated hypertension, smoking and alcohol abuse,  and severe MI in father at 40 years old, presents with 2 weeks of  intermittent tingling in the right hand, forearm, foot, lower leg, lip,  and tongue.  He presented to the ED 3 days prior to arrival with the  same complaint, was prescribed hydrochlorothiazide for hypertension; the  paresthesias resolved until this  morning.  The numbness and tingling is  waxing and waning with episode lasting about an hour.  He noted nothing  that exacerbates or relieves the symptoms.  Denies chest pain, radiating  pain, and shortness of breath.  Does endorse palpitations.   PHYSICAL EXAMINATION:  VITAL SIGNS:  Temperature 98.1; blood pressure  172/121, down to 153/114, status post 3 mg p.o. hydralazine; pulse 90;  respiratory rate 20, and O2 sats 99% on room air.  GENERAL:  Lying in emergency room bed in no acute distress.  EYES:  Left lateral gaze, nystagmus.  EOMI, PERRLA.  ENT AND NECK:  No JVD.  No carotid bruits.  No thyromegaly.  Moist  mucous of the oral membranes.  RESPIRATORY:  Clear to auscultation bilaterally with no crackles or  wheezing.  GI:  Abdomen is soft, nontender, and nondistended.  CARDIOVASCULAR:  Regular rate and rhythm.  S1 and S2 with no murmurs,  rubs, or gallops.  EXTREMITIES:  No peripheral edema and +2 dorsalis pedis pulses.  GENITOURINARY:  No CVA tenderness.  SKIN:  Warm and dry.  LYMPH:  No cervical lymphadenopathy.  NEUROLOGIC:  Alert and oriented x3 with cranial nerves II through XII  intact.  Strength is 5/5 in bilateral extremities.  Sensation to fine  touch intact in the lower extremities.  PSYCHIATRY:  Pleasant and cooperative.   ADMITTING LABORATORY DATA:  BMET; sodium 137, potassium 3.7, chloride  104, bicarbonate 25, BUN 16, creatinine 1.5, and glucose 98.  CBC; Monsour  blood cell 5.6, hemoglobin 17, and platelets 255.  CK-MB 2.6, troponin I  less than 0.05, and myoglobin 21.   HOSPITAL COURSE:  1. Left ventricular hypertrophy with mild outflow tract obstruction.      Left ventricular hypertrophy was possibly likely secondary to      chronic uncontrolled hypertension, given LVH, hypertension, smoking      history, family history of premature cardiovascular disease, T-wave      inversions in V5 to V6 and V2 to V3 and ST-segment changes in V2 to      V4 were concerning  for acute coronary syndrome with atypical      palpitations.  Cardiology was consulted at admission and agreed.      Aspirin 325 mg was given in the emergency department daily,      labetalol 200 mg p.o.  twice daily.  Simvastatin, low-dose      lisinopril, and therapeutic heparin were initiated.  The patient      was kept n.p.o. for possible catheterization.  Cardiac enzymes q.8      h. x2 were negative, however 2-D echocardiogram showed marked LVH      with mild outflow tract obstruction.  Given these findings,      Cardiology chose to do a catheterization on hospital day 2.      Catheterization showed normal coronary arteries.  Repeat EKG on      hospital day 3 consistent with longstanding hypertension.  Fasting      lipid panel and hemoglobin A1c were checked for risk      stratification.  LDL was 177, total cholesterol 244, HDL 50, and      triglycerides 87.  Hemoglobin A1c was 5.9%.  In short, the patient      with longstanding hypertension, history of smoking, and family      history of premature cardiovascular disease, and significant left      ventricular hypertrophy, abnormal baseline EKGs, and      hyperlipidemia, but did not have an acute coronary event with      normal coronary artery.  He was discharged with a statin, beta-      blocker, calcium channel blocker, and ACE inhibitor, all are in [address] 4 dollar list, and he will follow up with Dr. Algie Coffer in 2      weeks.  2. CVA.  The patient presented with subjective right forearm, hand,      lower leg, left thumb tingling with negative neurological exam.      Noncontrast head CT was negative for acute hemorrhage, but this      show lacunar infarct in the right basal ganglia of unknown age.      MRI and MRA revealed 2 acute strokes in the left thalamus/posterior      limb internal capsule and the smaller in the right parietal      subcortical Gundry matter.  The imaging also showed diffuse cranial      chronic small  vessel changes and medium-sized atherosclerosis,      premature for age.  Neurology was consulted and Dr. Sharene Skeans      confirmed that the left thalamic infarct was responsible for the      patient's right-sided paresthesias.  We discussed prognosis with      the patient indicating that normal sensation may or may not return      and he agreed with [place], prescribing aspirin 325 mg p.o. daily,      hyperlipidemia, smoking cessation are also be important for      prevention.  3. Hypertension.  The patient reports he has had untreated      hypertension for about 20 years; LVH was evident and impressive on      EKG and 2-D echo.  At the time of admission, BP was 172/121.  The      patient was started labetalol 200 mg p.o. b.i.d. and lisinopril 5      mg daily at admission.  At the time of discharge, his BP was      143/96. Given the mild outflow tract obstruction seen on echo it      was referable to avoid decrease in cardiac preload of diuretics.      The hydrochlorothiazide that was prescribed, the patient treated  prior to arrival in emergency department was discontinued, and he      was discharged on a beta-blocker, calcium channel blocker, and ACE      initiator per Cardiology's recommendation.  4. Hyperlipidemia.  Newly diagnosed as part of cardiac risk      assessment.  LDL was 177.  I have started pravastatin 40 mg daily.      It may be titrated up in the outpatient clinic.  5. Cigarette smoking.  A 40-pack-year smoking history of increased      smoking since unemployed and father's death.  Nicoderm CQ patch      twice daily during the hospitalization and smoking cessation      counseled was held with the patient.  Importance of quitting was      explained in detail, and he says he is serious about quitting      because this hospitalization scared him and he has 6 kids, ages 2-      80 to think of.  He prefers to quit cold-turkey. This will  need to      be readdressed in  outpatient clinic.  6. Alcohol abuse.  Inconsistent history reported by the patient.  He      had an average of a 6 pack of beer per night as an estimate.  He      was supplemented with folate and thiamine at hospitalization and      CIWA protocol was followed for first 2 days.  His alchohol level      was undetectable and he had no significant signs or symptoms of      alcohol withdrawal.  The patient has been an AA in the past and      says that was helpful.  Again, he said he was scared and will not      have the drinks again.  Social work consult was ordered given his      increasing substance abuse in the setting of recent unemployment      and death of his father.   DISCHARGE VITALS:  Temperature 98.4, pulse 68, respirations 20, blood  pressure 156/110, and oxygen saturation 100% on room air.   DISCHARGE LABORATORY DATA:  CBC; Labelle blood cells 5.1, hemoglobin 13.9,  and platelets 224.  Heavy metal profile; negative for arsenic, mercury,  and lead.   Other labs assessed during hospitalization include gonorrhea and  Chlamydia in urine was negative.  HIV antibody test nonreactive.  Urine  drug screen negative.  RPR nonreactive.  Cardiac enzymes negative.      Marinda Elk, M.D.  Electronically Signed      Chauncey Reading, D.O.  Electronically Signed    AF/MEDQ  D:  04/16/2008  T:  04/17/2008  Job:  [id number]   cc:   Marinda Elk, M.D.  Ricki Rodriguez, M.D.

## 2011-01-07 ENCOUNTER — Emergency Department (HOSPITAL_COMMUNITY): Payer: Self-pay

## 2011-01-07 ENCOUNTER — Observation Stay (HOSPITAL_COMMUNITY): Payer: Self-pay

## 2011-01-07 ENCOUNTER — Observation Stay (HOSPITAL_COMMUNITY)
Admission: EM | Admit: 2011-01-07 | Discharge: 2011-01-07 | Disposition: A | Discharge status: Home / Self Care | Payer: Self-pay | Attending: Emergency Medicine | Admitting: Emergency Medicine

## 2011-01-07 DIAGNOSIS — R079 Chest pain, unspecified: Principal | ICD-10-CM | POA: Insufficient documentation

## 2011-01-07 LAB — TROPONIN I: Troponin I: <0.3 ng/mL (ref <0.30)

## 2011-01-07 LAB — POCT I-STAT, CHEM 8
BUN: 9 mg/dL (ref 6–23)
Calcium, Ion: 1.02 mmol/L — ABNORMAL LOW (ref 1.12–1.32)
Creatinine, Ser: 0.9 mg/dL (ref 0.50–1.35)
Glucose, Bld: 94 mg/dL (ref 70–99)
TCO2: 21 mmol/L (ref 0–100)

## 2011-01-07 LAB — DIFFERENTIAL
Basophils Relative: 1 % (ref 0–1)
Eosinophils Absolute: 0.2 10*3/uL (ref 0.0–0.7)
Eosinophils Relative: 3 % (ref 0–5)
Monocytes Absolute: 0.4 10*3/uL (ref 0.1–1.0)
Monocytes Relative: 7 % (ref 3–12)

## 2011-01-07 LAB — CBC
MCH: 31 pg (ref 26.0–34.0)
MCHC: 35.9 g/dL (ref 30.0–36.0)
Platelets: 263 10*3/uL (ref 150–400)

## 2011-01-07 LAB — CK TOTAL AND CKMB (NOT AT ARMC): Total CK: 509 U/L — ABNORMAL HIGH (ref 7–232)

## 2011-01-07 MED ORDER — IOHEXOL 350 MG/ML SOLN
100.0000 mL | Freq: Once | INTRAVENOUS | Status: AC | PRN
  Administered 2011-01-07: 100 mL via INTRAVENOUS

## 2011-04-13 LAB — HEAVY METALS, BLOOD
Arsenic: <2 ug/L (ref <11.0)
Lead: 4.2 ug/dL (ref <10.0)
Mercury: <2 ug/L (ref <5.0)

## 2011-04-13 LAB — MICROALBUMIN / CREATININE URINE RATIO
Microalb Creat Ratio: 3.5
Microalb, Ur: 0.64

## 2011-04-13 LAB — BASIC METABOLIC PANEL
BUN: 11
BUN: 17
CO2: 24
Chloride: 108
Chloride: 104
Creatinine, Ser: 1.27
Glucose, Bld: 128 — ABNORMAL HIGH
Glucose, Bld: 92
Potassium: 4
Sodium: 139

## 2011-04-13 LAB — URINALYSIS, ROUTINE W REFLEX MICROSCOPIC
Ketones, ur: NEGATIVE
Nitrite: NEGATIVE
Protein, ur: NEGATIVE

## 2011-04-13 LAB — CBC
HCT: 49.5
HCT: 45.5
Hemoglobin: 16.7
Hemoglobin: 13.9
MCHC: 33.9
MCHC: 33.5
MCHC: 34
MCV: 89.1
MCV: 89.4
Platelets: 238
Platelets: 255
Platelets: 233
RBC: 5.03
RBC: 4.64
RDW: 13.9
RDW: 13.7
RDW: 13.9
WBC: 5.6

## 2011-04-13 LAB — DIFFERENTIAL
Basophils Relative: 1
Eosinophils Absolute: 0.2
Eosinophils Absolute: 0.2
Eosinophils Relative: 3
Eosinophils Relative: 3
Lymphs Abs: 1.7
Monocytes Absolute: 0.6
Neutrophils Relative %: 40 — ABNORMAL LOW

## 2011-04-13 LAB — POCT I-STAT, CHEM 8
BUN: 16
Calcium, Ion: 1.2
Creatinine, Ser: 1.5
Glucose, Bld: 98
Hemoglobin: 18 — ABNORMAL HIGH
Sodium: 137
TCO2: 25

## 2011-04-13 LAB — HEMOGLOBIN A1C: Hgb A1c MFr Bld: 5.9

## 2011-04-13 LAB — MAGNESIUM: Magnesium: 2.2

## 2011-04-13 LAB — COMPREHENSIVE METABOLIC PANEL
ALT: 17
AST: 20
Albumin: 3.9
Alkaline Phosphatase: 76
Calcium: 10
GFR calc Af Amer: >60
Potassium: 4.1
Sodium: 135
Total Protein: 7.3

## 2011-04-13 LAB — ALDOSTERONE + RENIN ACTIVITY W/ RATIO
Aldosterone, Serum: <1 ng/dL
Aldosterone/Renin Activity, Ratio: UNDETERMINED
Renin Activity: 0.6 ng/mL/h

## 2011-04-13 LAB — CARDIAC PANEL(CRET KIN+CKTOT+MB+TROPI)
Relative Index: 1.3
Troponin I: 0.02
Troponin I: 0.01

## 2011-04-13 LAB — CK TOTAL AND CKMB (NOT AT ARMC)
CK, MB: 1.8
Relative Index: 1.2
Total CK: 150

## 2011-04-13 LAB — POCT CARDIAC MARKERS: CKMB, poc: 2.6

## 2011-04-13 LAB — RAPID URINE DRUG SCREEN, HOSP PERFORMED
Benzodiazepines: NOT DETECTED
Cocaine: NOT DETECTED
Tetrahydrocannabinol: NOT DETECTED

## 2011-04-13 LAB — RENAL FUNCTION PANEL
BUN: 14
Chloride: 101
Glucose, Bld: 86
Potassium: 3.6

## 2011-04-13 LAB — LIPID PANEL: Cholesterol: 244 — ABNORMAL HIGH

## 2011-04-13 LAB — HEPARIN LEVEL (UNFRACTIONATED)
Heparin Unfractionated: 0.51
Heparin Unfractionated: 0.44
Heparin Unfractionated: <0.1 — ABNORMAL LOW

## 2011-04-13 LAB — URINE MICROSCOPIC-ADD ON

## 2011-04-13 LAB — PROTIME-INR: Prothrombin Time: 14.5

## 2011-04-13 LAB — GC/CHLAMYDIA PROBE AMP, URINE: Chlamydia, Swab/Urine, PCR: NEGATIVE

## 2011-10-01 ENCOUNTER — Other Ambulatory Visit: Payer: Self-pay | Admitting: Family Medicine

## 2013-12-13 ENCOUNTER — Observation Stay (HOSPITAL_COMMUNITY)
Admission: EM | Admit: 2013-12-13 | Discharge: 2013-12-14 | Disposition: A | Discharge status: Home / Self Care | Payer: Self-pay | Attending: Internal Medicine | Admitting: Internal Medicine

## 2013-12-13 ENCOUNTER — Encounter (HOSPITAL_COMMUNITY): Payer: Self-pay | Admitting: Emergency Medicine

## 2013-12-13 DIAGNOSIS — I160 Hypertensive urgency: Secondary | ICD-10-CM

## 2013-12-13 DIAGNOSIS — Z8673 Personal history of transient ischemic attack (TIA), and cerebral infarction without residual deficits: Secondary | ICD-10-CM | POA: Insufficient documentation

## 2013-12-13 DIAGNOSIS — E785 Hyperlipidemia, unspecified: Secondary | ICD-10-CM | POA: Diagnosis present

## 2013-12-13 DIAGNOSIS — R079 Chest pain, unspecified: Principal | ICD-10-CM | POA: Insufficient documentation

## 2013-12-13 DIAGNOSIS — B86 Scabies: Secondary | ICD-10-CM

## 2013-12-13 DIAGNOSIS — Z79899 Other long term (current) drug therapy: Secondary | ICD-10-CM | POA: Insufficient documentation

## 2013-12-13 DIAGNOSIS — I10 Essential (primary) hypertension: Secondary | ICD-10-CM | POA: Insufficient documentation

## 2013-12-13 DIAGNOSIS — F17200 Nicotine dependence, unspecified, uncomplicated: Secondary | ICD-10-CM | POA: Insufficient documentation

## 2013-12-13 DIAGNOSIS — Z8679 Personal history of other diseases of the circulatory system: Secondary | ICD-10-CM

## 2013-12-13 MED ORDER — HYDRALAZINE HCL 20 MG/ML IJ SOLN
20.0000 mg | Freq: Once | INTRAMUSCULAR | Status: AC
  Administered 2013-12-14: 20 mg via INTRAVENOUS
  Filled 2013-12-13: qty 1

## 2013-12-13 NOTE — ED Notes (Addendum)
Denies pain. Denies HA. But reports a throbbing in L parietal head. Pt checked BP tonight and it was high. Pt stopped taking BP med AMA in 2013. Denies sx other than head throbbing.

## 2013-12-13 NOTE — ED Provider Notes (Signed)
CSN: [id number]     Arrival date & time 12/13/13  2143 History   First MD Initiated Contact with Patient 12/13/13 2346     Chief Complaint  Patient presents with  . Headache     (Consider location/radiation/quality/duration/timing/severity/associated sxs/prior Treatment) HPI Comments: Patient reports that he had onset of a mild left-sided headache and left-sided chest pain while at work earlier today. He checked his blood pressure and it was very high. Patient reports a history of hypertension, has not taken any blood pressure medications for approximately 2 years, however. Patient thinks that he was previously on hydrochlorothiazide. Patient describes the pain as a dull throb. He does not have any neck pain or stiffness, numbness, tingling or weakness in the extremities.  Patient reports that the chest pain was in the left lateral chest, continuous for approximately 4 hours. He did not notice any exertional component. He has not had any shortness of breath.  Patient is a 43 y.o. male presenting with headaches.  Headache Associated symptoms: no dizziness     Past Medical History  Diagnosis Date  . Stroke 04/2008    left thalamus/posterior limb internal capsule and right parietal subcortical Lezama matter.  . Hypertension   . Left ventricular hypertrophy   . Hypertension    History reviewed. No pertinent past surgical history. Family History  Problem Relation Age of Onset  . Stroke       first-degree relative less than 60   History  Substance Use Topics  . Smoking status: Current Every Day Smoker -- 0.50 packs/day    Types: Cigarettes  . Smokeless tobacco: Not on file  . Alcohol Use: Not on file    Review of Systems  Eyes: Negative for visual disturbance.  Respiratory: Negative for shortness of breath.   Cardiovascular: Positive for chest pain.  Neurological: Positive for headaches. Negative for dizziness and syncope.  All other systems reviewed and are  negative.     Allergies  Review of patient's allergies indicates no known allergies.  Home Medications   Prior to Admission medications   Medication Sig Start Date End Date Taking? Authorizing Provider  carvedilol (COREG) 25 MG tablet Take 1 tablet (25 mg total) by mouth 2 (two) times daily. 09/01/10 09/02/11  Janell Quiet, MD   BP 178/100  Pulse 74  Temp(Src) 98.1 F (36.7 C) (Oral)  Resp 24  Wt 179 lb (81.194 kg)  SpO2 100% Physical Exam  Constitutional: He is oriented to person, place, and time. He appears well-developed and well-nourished. No distress.  HENT:  Head: Normocephalic and atraumatic.  Right Ear: Hearing normal.  Left Ear: Hearing normal.  Nose: Nose normal.  Mouth/Throat: Oropharynx is clear and moist and mucous membranes are normal.  Eyes: Conjunctivae and EOM are normal. Pupils are equal, round, and reactive to light.  Neck: Normal range of motion. Neck supple.  Cardiovascular: Regular rhythm, S1 normal and S2 normal.  Exam reveals no gallop and no friction rub.   No murmur heard. Pulmonary/Chest: Effort normal and breath sounds normal. No respiratory distress. He exhibits no tenderness.  Abdominal: Soft. Normal appearance and bowel sounds are normal. There is no hepatosplenomegaly. There is no tenderness. There is no rebound, no guarding, no tenderness at McBurney's point and negative Murphy's sign. No hernia.  Musculoskeletal: Normal range of motion.  Neurological: He is alert and oriented to person, place, and time. He has normal strength. No cranial nerve deficit or sensory deficit. Coordination normal. GCS eye subscore is 4. GCS verbal  subscore is 5. GCS motor subscore is 6.  Skin: Skin is warm, dry and intact. No rash noted. No cyanosis.  Psychiatric: He has a normal mood and affect. His speech is normal and behavior is normal. Thought content normal.    ED Course  Procedures (including critical care time) Labs Review Labs Reviewed  CBC WITH  DIFFERENTIAL - Abnormal; Notable for the following:    Hemoglobin 12.9 (*)    Eosinophils Relative 6 (*)    All other components within normal limits  BASIC METABOLIC PANEL - Abnormal; Notable for the following:    GFR calc non Af Amer 82 (*)    All other components within normal limits  TROPONIN I    Imaging Review Dg Chest 2 View  12/14/2013   CLINICAL DATA:  High blood pressure and chest pain.  EXAM: CHEST  2 VIEW  COMPARISON:  01/07/2011.  FINDINGS: Normal heart size and mediastinal contours. No acute infiltrate or edema. No effusion or pneumothorax. No acute osseous findings.  IMPRESSION: No active cardiopulmonary disease.   Electronically Signed   By: Jorje Guild M.D.   On: 12/14/2013 00:12     EKG Interpretation   Date/Time:  Thursday December 14 2013 00:35:12 EDT Ventricular Rate:  58 PR Interval:  171 QRS Duration: 88 QT Interval:  434 QTC Calculation: 426 R Axis:   33 Text Interpretation:  Sinus rhythm Consider left ventricular hypertrophy  Nonspecific T abnormalities, inferior leads Anterior ST elevation,  probably due to LVH No significant change since last tracing Confirmed by  Purvis Sidle  MD, [place] [phone]) on 12/14/2013 12:59:22 AM      MDM   Final diagnoses:  Hypertensive urgency  Chest pain    Patient presents to the ER for evaluation of headache and chest pain. Patient has a history of untreated hypertension. He reports that he had onset of a throbbing left-sided headache while at work tonight, then developed left-sided chest pain. This can last for approximately 4 hours but did resolve prior to arrival to the ER. Headache was still present upon arrival. He described it as mild, however. Patient was noted to be markedly hypertensive on arrival. He does not have a cardiac history, but does have a previous history of stroke. He had normal neurologic exam, no concern for stroke or intracranial bleed on examination and history. No need for imaging.  Patient  administered IV hydralazine. He has had some improvement in his blood pressure. Maximum blood pressure was 210/150. After hydralazine, patient now 170/100. Headache has completely resolved. No recurrence of chest pain. Because of the extent of his hypertension earlier, as well as chest pain, will ask medicine to admit him to the hospital for further management and observation.    Orpah Greek, MD 12/14/13 [phone]

## 2013-12-14 ENCOUNTER — Emergency Department (HOSPITAL_COMMUNITY): Payer: Self-pay

## 2013-12-14 DIAGNOSIS — F17200 Nicotine dependence, unspecified, uncomplicated: Secondary | ICD-10-CM

## 2013-12-14 DIAGNOSIS — F1010 Alcohol abuse, uncomplicated: Secondary | ICD-10-CM

## 2013-12-14 DIAGNOSIS — E785 Hyperlipidemia, unspecified: Secondary | ICD-10-CM

## 2013-12-14 DIAGNOSIS — B86 Scabies: Secondary | ICD-10-CM

## 2013-12-14 DIAGNOSIS — R079 Chest pain, unspecified: Secondary | ICD-10-CM | POA: Diagnosis present

## 2013-12-14 DIAGNOSIS — Z8673 Personal history of transient ischemic attack (TIA), and cerebral infarction without residual deficits: Secondary | ICD-10-CM

## 2013-12-14 DIAGNOSIS — I10 Essential (primary) hypertension: Secondary | ICD-10-CM

## 2013-12-14 DIAGNOSIS — I160 Hypertensive urgency: Secondary | ICD-10-CM | POA: Diagnosis present

## 2013-12-14 LAB — PROTIME-INR
INR: 0.98 (ref 0.00–1.49)
Prothrombin Time: 12.8 seconds (ref 11.6–15.2)

## 2013-12-14 LAB — HEPATIC FUNCTION PANEL
ALT: 14 U/L (ref 0–53)
AST: 22 U/L (ref 0–37)
Albumin: 3.5 g/dL (ref 3.5–5.2)
Alkaline Phosphatase: 95 U/L (ref 39–117)
Bilirubin, Direct: <0.2 mg/dL (ref 0.0–0.3)
TOTAL PROTEIN: 7.1 g/dL (ref 6.0–8.3)
Total Bilirubin: <0.2 mg/dL — ABNORMAL LOW (ref 0.3–1.2)

## 2013-12-14 LAB — BASIC METABOLIC PANEL
BUN: 10 mg/dL (ref 6–23)
BUN: 11 mg/dL (ref 6–23)
CHLORIDE: 105 meq/L (ref 96–112)
CHLORIDE: 105 meq/L (ref 96–112)
CO2: 21 meq/L (ref 19–32)
CO2: 26 meq/L (ref 19–32)
CREATININE: 0.88 mg/dL (ref 0.50–1.35)
CREATININE: 1.09 mg/dL (ref 0.50–1.35)
Calcium: 9.1 mg/dL (ref 8.4–10.5)
Calcium: 9.3 mg/dL (ref 8.4–10.5)
GFR calc Af Amer: >90 mL/min (ref >90)
GFR calc Af Amer: >90 mL/min (ref >90)
GFR calc non Af Amer: 82 mL/min — ABNORMAL LOW (ref >90)
GFR calc non Af Amer: >90 mL/min (ref >90)
GLUCOSE: 82 mg/dL (ref 70–99)
Glucose, Bld: 96 mg/dL (ref 70–99)
Potassium: 3.8 mEq/L (ref 3.7–5.3)
Potassium: 4.1 mEq/L (ref 3.7–5.3)
Sodium: 140 mEq/L (ref 137–147)
Sodium: 141 mEq/L (ref 137–147)

## 2013-12-14 LAB — GLUCOSE, CAPILLARY
Glucose-Capillary: 95 mg/dL (ref 70–99)
Glucose-Capillary: 119 mg/dL — ABNORMAL HIGH (ref 70–99)

## 2013-12-14 LAB — TROPONIN I
Troponin I: <0.3 ng/mL
Troponin I: <0.3 ng/mL (ref <0.30)

## 2013-12-14 LAB — CBC WITH DIFFERENTIAL/PLATELET
Basophils Absolute: 0 10*3/uL (ref 0.0–0.1)
Basophils Relative: 1 % (ref 0–1)
EOS PCT: 6 % — AB (ref 0–5)
Eosinophils Absolute: 0.3 10*3/uL (ref 0.0–0.7)
HEMATOCRIT: 39.3 % (ref 39.0–52.0)
Hemoglobin: 12.9 g/dL — ABNORMAL LOW (ref 13.0–17.0)
LYMPHS ABS: 1.7 10*3/uL (ref 0.7–4.0)
LYMPHS PCT: 39 % (ref 12–46)
MCH: 29.1 pg (ref 26.0–34.0)
MCHC: 32.8 g/dL (ref 30.0–36.0)
MCV: 88.5 fL (ref 78.0–100.0)
MONO ABS: 0.4 10*3/uL (ref 0.1–1.0)
Monocytes Relative: 9 % (ref 3–12)
NEUTROS ABS: 2 10*3/uL (ref 1.7–7.7)
Neutrophils Relative %: 45 % (ref 43–77)
Platelets: 231 10*3/uL (ref 150–400)
RBC: 4.44 MIL/uL (ref 4.22–5.81)
RDW: 14.8 % (ref 11.5–15.5)
WBC: 4.5 10*3/uL (ref 4.0–10.5)

## 2013-12-14 LAB — HIV ANTIBODY (ROUTINE TESTING W REFLEX): HIV 1&2 Ab, 4th Generation: NONREACTIVE

## 2013-12-14 LAB — LIPID PANEL
CHOLESTEROL: 222 mg/dL — AB (ref 0–200)
HDL: 70 mg/dL (ref >39)
LDL Cholesterol: 116 mg/dL — ABNORMAL HIGH (ref 0–99)
TRIGLYCERIDES: 180 mg/dL — AB (ref <150)
Total CHOL/HDL Ratio: 3.2 RATIO
VLDL: 36 mg/dL (ref 0–40)

## 2013-12-14 LAB — RAPID URINE DRUG SCREEN, HOSP PERFORMED
AMPHETAMINES: NOT DETECTED
BENZODIAZEPINES: NOT DETECTED
Barbiturates: NOT DETECTED
Cocaine: NOT DETECTED
OPIATES: NOT DETECTED
TETRAHYDROCANNABINOL: NOT DETECTED

## 2013-12-14 MED ORDER — HYDROCHLOROTHIAZIDE 25 MG PO TABS
25.0000 mg | ORAL_TABLET | Freq: Every day | ORAL | Status: DC
  Administered 2013-12-14: 25 mg via ORAL
  Filled 2013-12-14: qty 1

## 2013-12-14 MED ORDER — LISINOPRIL 10 MG PO TABS: 10.0000 mg | ORAL_TABLET | Freq: Every day | ORAL | Status: DC

## 2013-12-14 MED ORDER — ACETAMINOPHEN 650 MG RE SUPP: 650.0000 mg | Freq: Four times a day (QID) | RECTAL | Status: DC | PRN

## 2013-12-14 MED ORDER — ASPIRIN EC 81 MG PO TBEC
81.0000 mg | DELAYED_RELEASE_TABLET | Freq: Every day | ORAL | Status: DC
  Administered 2013-12-14: 81 mg via ORAL
  Filled 2013-12-14: qty 1

## 2013-12-14 MED ORDER — HYDRALAZINE HCL 20 MG/ML IJ SOLN
10.0000 mg | Freq: Once | INTRAMUSCULAR | Status: AC
  Administered 2013-12-14: 10 mg via INTRAVENOUS
  Filled 2013-12-14: qty 1

## 2013-12-14 MED ORDER — ACETAMINOPHEN 325 MG PO TABS: 650.0000 mg | ORAL_TABLET | Freq: Four times a day (QID) | ORAL | Status: DC | PRN

## 2013-12-14 MED ORDER — HYDROCHLOROTHIAZIDE 25 MG PO TABS: 25.0000 mg | ORAL_TABLET | Freq: Every day | ORAL | Status: DC

## 2013-12-14 MED ORDER — NITROGLYCERIN 0.4 MG SL SUBL: 0.4000 mg | SUBLINGUAL_TABLET | SUBLINGUAL | Status: DC | PRN

## 2013-12-14 MED ORDER — LISINOPRIL 10 MG PO TABS
10.0000 mg | ORAL_TABLET | Freq: Every day | ORAL | Status: DC
  Administered 2013-12-14: 10 mg via ORAL
  Filled 2013-12-14: qty 1

## 2013-12-14 MED ORDER — SODIUM CHLORIDE 0.9 % IV SOLN: 250.0000 mL | INTRAVENOUS | Status: DC | PRN

## 2013-12-14 MED ORDER — HYDRALAZINE HCL 20 MG/ML IJ SOLN: 5.0000 mg | INTRAMUSCULAR | Status: DC | PRN

## 2013-12-14 MED ORDER — SODIUM CHLORIDE 0.9 % IJ SOLN
3.0000 mL | Freq: Two times a day (BID) | INTRAMUSCULAR | Status: DC
  Administered 2013-12-14: 3 mL via INTRAVENOUS

## 2013-12-14 MED ORDER — LOVASTATIN 20 MG PO TABS: 20.0000 mg | ORAL_TABLET | Freq: Every day | ORAL | Status: DC

## 2013-12-14 MED ORDER — SODIUM CHLORIDE 0.9 % IJ SOLN: 3.0000 mL | INTRAMUSCULAR | Status: DC | PRN

## 2013-12-14 MED ORDER — PERMETHRIN 5 % EX CREA
TOPICAL_CREAM | Freq: Once | CUTANEOUS | Status: AC
  Administered 2013-12-14: 10:00:00 via TOPICAL
  Filled 2013-12-14: qty 60

## 2013-12-14 MED ORDER — ASPIRIN 81 MG PO TBEC: 81.0000 mg | DELAYED_RELEASE_TABLET | Freq: Every day | ORAL | Status: DC

## 2013-12-14 MED ORDER — ENOXAPARIN SODIUM 40 MG/0.4ML ~~LOC~~ SOLN
40.0000 mg | SUBCUTANEOUS | Status: DC
  Administered 2013-12-14: 40 mg via SUBCUTANEOUS
  Filled 2013-12-14: qty 0.4

## 2013-12-14 NOTE — Discharge Instructions (Signed)
Start taking Lisinopril 10mg  and Hydrochlorothiazide 25mg  a day (for blood pressure) Please also start taking Aspirin 81mg  and Lovastatin 20mg  (for cholesterol) Please decrease smoking. I have sent the prescriptions to [facility] all medications are on the $4 list. Please bring w2, social security card, identification, any tax statements and bank account information to your appointment with [facility] [place] to get financial assistance with your doctors appointments.

## 2013-12-14 NOTE — Progress Notes (Signed)
Spoke with on call doctor. Pt bp 180's/110's. MD ok with BP for now. No new orders will continue to monitor.

## 2013-12-14 NOTE — Progress Notes (Signed)
Subjective: No complaints. Denies HA, chest pain, dizziness.  Does have some itching at the groin and hands.  Reports will take $4 medications if prescribed and will follow up at [facility].  He notes he smokes .5 to 1ppd.  Reports was able to quit without assistance for 3 months after stroke.  He does verbalize desire to quit but does not want NRT or medications to quit at this time. Objective: Vital signs in last 24 hours: Filed Vitals:   12/14/13 0329 12/14/13 0415 12/14/13 0431 12/14/13 0951  BP: 159/93 173/105 185/116 159/105  Pulse: 91 74 71 69  Temp:   98.2 F (36.8 C)   TempSrc:   Oral   Resp:  16 20   Height:   5\' 5"  (1.651 m)   Weight:   177 lb 4.8 oz (80.423 kg)   SpO2:  100%     Weight change:  No intake or output data in the 24 hours ending 12/14/13 1310 General: resting in bed HEENT: EOMI Cardiac: RRR, no murmurs  Pulm: CTAB Abd: soft, nontender, nondistended, BS present Ext: warm and well perfused, no pedal edema, patient does have burrow marks consistent with scabies on bilateral wrists, he has a few circular scale lesions on lower extremities. Neuro: alert and oriented X3 Lab Results:  Basic Metabolic Panel:  Recent Labs Lab 12/13/13 2359 12/14/13 0445  NA 140 141  K 4.1 3.8  CL 105 105  CO2 26 21  GLUCOSE 82 96  BUN 11 10  CREATININE 1.09 0.88  CALCIUM 9.1 9.3   Liver Function Tests:  Recent Labs Lab 12/14/13 0445  AST 22  ALT 14  ALKPHOS 95  BILITOT <0.2*  PROT 7.1  ALBUMIN 3.5   CBC:  Recent Labs Lab 12/13/13 2359  WBC 4.5  NEUTROABS 2.0  HGB 12.9*  HCT 39.3  MCV 88.5  PLT 231   Cardiac Enzymes:  Recent Labs Lab 12/13/13 2359 12/14/13 0252 12/14/13 0736  TROPONINI <0.30 <0.30 <0.30   CBG:  Recent Labs Lab 12/14/13 0748 12/14/13 1131  GLUCAP 95 119*   Hemoglobin A1C: No results found for this basename: HGBA1C,  in the last 168 hours Fasting Lipid Panel:  Recent Labs Lab 12/14/13 0445  CHOL 222*  HDL 70  LDLCALC  116*  TRIG 180*  CHOLHDL 3.2   Thyroid Function Tests: No results found for this basename: TSH, T4TOTAL, FREET4, T3FREE, THYROIDAB,  in the last 168 hours Coagulation:  Recent Labs Lab 12/14/13 0445  LABPROT 12.8  INR 0.98   Anemia Panel: No results found for this basename: VITAMINB12, FOLATE, FERRITIN, TIBC, IRON, RETICCTPCT,  in the last 168 hours Urine Drug Screen: Drugs of Abuse     Component Value Date/Time   LABOPIA NONE DETECTED 12/14/2013 0230   COCAINSCRNUR NONE DETECTED 12/14/2013 0230   LABBENZ NONE DETECTED 12/14/2013 0230   AMPHETMU NONE DETECTED 12/14/2013 0230   THCU NONE DETECTED 12/14/2013 0230   LABBARB NONE DETECTED 12/14/2013 0230    Micro Results: No results found for this or any previous visit (from the past 240 hour(s)). Studies/Results: Dg Chest 2 View  12/14/2013   CLINICAL DATA:  High blood pressure and chest pain.  EXAM: CHEST  2 VIEW  COMPARISON:  01/07/2011.  FINDINGS: Normal heart size and mediastinal contours. No acute infiltrate or edema. No effusion or pneumothorax. No acute osseous findings.  IMPRESSION: No active cardiopulmonary disease.   Electronically Signed   By: Jorje Guild M.D.   On: 12/14/2013  00:12   Medications: I have reviewed the patient's current medications. Scheduled Meds: . aspirin EC  81 mg Oral Daily  . enoxaparin (LOVENOX) injection  40 mg Subcutaneous Q24H  . hydrochlorothiazide  25 mg Oral Daily  . lisinopril  10 mg Oral Daily  . sodium chloride  3 mL Intravenous Q12H   Continuous Infusions:  PRN Meds:.sodium chloride, acetaminophen, acetaminophen, hydrALAZINE, nitroGLYCERIN, sodium chloride Assessment/Plan::   Hypertensive urgency - BP improved to 150s/100 this AM. Patinent remains asymptomatic. Troponins negative.  If repeat BP remains improved this afternoon can be discharged. - Will discharge on Lisinopril 10mg , HCTZ 25mg  daily (both $4 medications) - Patient has appointment to follow up in clinic    HYPERLIPIDEMIA -  Will start Lovastatin on discharge.    TOBACCO ABUSE - consoled on importance of cessation. Does not want assistance with NRT or medications at this time.    CEREBROVASCULAR ACCIDENT, HX OF - Patient should be on ASA and statin for secondary prevention. Informed of this and will prescribe Lovastatin ($4) on discharge.    Scabies - Primetherin cream while inpatient. - has prescription at pharmacy  Dispo: Discharge home today pending repeat BP  The patient does have a current PCP (No Pcp Per Patient) and does need an [facility] hospital follow-up appointment after discharge.  The patient does not have transportation limitations that hinder transportation to clinic appointments.  .Services Needed at time of discharge: Y = Yes, Blank = No PT:   OT:   RN:   Equipment:   Other:     LOS: 1 day   Joni Reining, DO 12/14/2013, 1:10 PM

## 2013-12-14 NOTE — Care Management Note (Signed)
    Page 1 of 1   12/14/2013     11:56:08 AM CARE MANAGEMENT NOTE 12/14/2013  Patient:  [facility]   Account Number:  [id number]  Date Initiated:  12/14/2013  Documentation initiated by:  GRAVES-BIGELOW,Trey Gulbranson  Subjective/Objective Assessment:   Pt admitted for Elevated BP. Pt without PCP and insurance. Please write Rx for cheaper medications preferrably [facility] $4.00 med list if possible.     Action/Plan:   CM received referral for PCP. In Epic looks like pt will f/u at the internal medicine clinic.   Anticipated DC Date:  12/15/2013   Anticipated DC Plan:  [place]  CM consult  Follow-up appt scheduled  [facility]      Choice offered to / List presented to:             Status of service:  In process, will continue to follow Medicare Important Message given?  NO (If response is "NO", the following Medicare IM given date fields will be blank) Date Medicare IM given:   Date Additional Medicare IM given:    Discharge Disposition:  HOME/SELF CARE  Per UR Regulation:  Reviewed for med. necessity/level of care/duration of stay  If discussed at [place] of Stay Meetings, dates discussed:    Comments:

## 2013-12-14 NOTE — ED Notes (Signed)
MD at bedside. 

## 2013-12-14 NOTE — H&P (Signed)
  Date: 12/14/2013  Patient name: Glenn Newton  Medical record number: [id number]  Date of birth: 1970-08-24   I have seen and evaluated Glenn Newton and discussed their care with the Residency Team. Glenn Newton was admitted for HTN urgency. He has known HTN and was tx with Coreg, titrated up to 25 mg, and HCTZ 25 but stopped coming to MD in 2012. He had CVA in 2009 but has not been taking an ASA.   He was recently dx with scabies at the health dept but has not yet been tx. Has been itching since moved into boarding home a few months ago.   He has about 2 cm annular lesions on LE. There are about 2 mm lesions on wrists and elbows. No lesion on finger webs.  Assessment and Plan: I have seen and evaluated the patient as outlined above. I agree with the formulated Assessment and Plan as detailed in the residents' admission note, with the following changes:   1. Accelerated HTN - Pt's sxs of head tingling and chest discomfort resolved in ED with one dose hydralazine and lowering of his BP. BP now in acceptable range of systolic 341'P and diastolic 379'K. OK to D/C home with ACEI / HCTZ combo pill. Needs IMC appt for BMP and B check.  2. H/o CVA - resume ASA QD.  3. Tobacco use - tobacco cessation counseling.  4. Scabies - Permetherin  Glenn Crews, MD 6/4/20152:02 PM

## 2013-12-14 NOTE — ED Notes (Signed)
Internal medicine at bedside

## 2013-12-14 NOTE — H&P (Signed)
Date: 12/14/2013               Patient Name:  Glenn Newton MRN: [id number]  DOB: 1970/08/07 Age / Sex: 43 y.o., male   PCP: No Pcp Per Patient         Medical Service: Internal Medicine Teaching Service         Attending Physician: Dr. Bartholomew Crews, MD    First Contact: Dr. Johnnette Gourd,  MD Pager: [phone]  Second Contact: Dr. Randell Loop, MD Pager: [phone]       After Hours (After 5p/  First Contact Pager: [phone]  weekends / holidays): Second Contact Pager: [phone]   Chief Complaint: Elevated BP  History of Present Illness: [name] is a 43 y.o. man with a h/o CCVA and HTN who presented to the ED 1 day after finding out his SBP was elevated to 205. He was seen in the health department for a skin rash in his groin on the day before the admission. Per the patient they tested him for Gonorrhea, syphilis and HIV. They said everything was negative, but that he had scabies. They also informed him that his BP was very elevated and that he needed to go to the hospital immediately. The patient refused this as he had to work. He went to work and had no problems. On the day of the admission, he went to work again. At work he was frustrated and became concerned about his BP being too high. He developed a tingling on the top of his head and mild 1/10 chest discomfort. The patient decided to leave work so that he could go to the ED. In the ED he was found to have BP of 221/131. He was given 10 mg IV hydralazine and his symptoms completely resolved. IMTS was consulted for admission.  He denies drug use. Admits to drinking 12 pack on weekends, but no OH during the week.  Of note, he has been treated with coreg 25 mg BID for HTN. He stopped taking this medication about 2 years ago, and has been lost to follow up with [facility].  He denies HA blurry vision, changes in bowel or bladder functio, abdominal pain, and chest pain. Denies SOB.  Meds: No current facility-administered medications for  this encounter.   Current Outpatient Prescriptions  Medication Sig Dispense Refill  . carvedilol (COREG) 25 MG tablet Take 1 tablet (25 mg total) by mouth 2 (two) times daily.  60 tablet  11    Allergies: Allergies as of 12/13/2013  . (No Known Allergies)   Past Medical History  Diagnosis Date  . Stroke 04/2008    left thalamus/posterior limb internal capsule and right parietal subcortical Stlaurent matter.  . Hypertension   . Left ventricular hypertrophy   . Hypertension    History reviewed. No pertinent past surgical history. Family History  Problem Relation Age of Onset  . Stroke       first-degree relative less than 60   History   Social History  . Marital Status: Legally Separated    Spouse Name: N/A    Number of Children: N/A  . Years of Education: N/A   Occupational History  .  unemployed for 2 years    Social History Main Topics  . Smoking status: Current Every Day Smoker -- 0.50 packs/day    Types: Cigarettes  . Smokeless tobacco: Not on file  . Alcohol Use: Not on file  . Drug Use: Not on file  .  Sexual Activity: Not on file   Other Topics Concern  . Not on file   Social History Narrative    Used to work in a warehouse currently drawing unemployment.       High school education.       Married but happily separated.       A total of 6 kids 2 from previous relationship and 4 with wife.    Review of Systems: Pertinent items are noted in HPI.  Physical Exam: Blood pressure 194/112, pulse 74, temperature 98.1 F (36.7 C), temperature source Oral, resp. rate 20, weight 179 lb (81.194 kg), SpO2 100.00%. Physical Exam  Constitutional: He is oriented to person, place, and time. He appears well-developed and well-nourished. No distress.  HENT:  Head: Normocephalic and atraumatic.  Eyes: EOM are normal. Pupils are equal, round, and reactive to light.  Mildly injected conjunctiva bil.  Cardiovascular: Normal rate, regular rhythm, normal heart sounds and  intact distal pulses.  Exam reveals no friction rub.   No murmur heard. Pulmonary/Chest: Effort normal and breath sounds normal. No respiratory distress. He has no wheezes. He has no rales.  Abdominal: Soft. Bowel sounds are normal. He exhibits no distension. There is no tenderness.  Neurological: He is alert and oriented to person, place, and time. No cranial nerve deficit. Coordination normal.  Skin: He is not diaphoretic.  Psychiatric: He has a normal mood and affect. His behavior is normal.     Lab results: Basic Metabolic Panel:  Recent Labs  12/13/13 2359  NA 140  K 4.1  CL 105  CO2 26  GLUCOSE 82  BUN 11  CREATININE 1.09  CALCIUM 9.1   CBC:  Recent Labs  12/13/13 2359  WBC 4.5  NEUTROABS 2.0  HGB 12.9*  HCT 39.3  MCV 88.5  PLT 231   Cardiac Enzymes:  Recent Labs  12/13/13 2359  TROPONINI <0.30   Urine Drug Screen: Drugs of Abuse     Component Value Date/Time   LABOPIA NONE DETECTED 04/11/2008 1802   COCAINSCRNUR NONE DETECTED 04/11/2008 1802   LABBENZ NONE DETECTED 04/11/2008 1802   AMPHETMU NONE DETECTED 04/11/2008 1802   THCU NONE DETECTED 04/11/2008 1802   LABBARB  Value: NONE DETECTED        DRUG SCREEN FOR MEDICAL PURPOSES ONLY.  IF CONFIRMATION IS NEEDED FOR ANY PURPOSE, NOTIFY LAB WITHIN 5 DAYS. 04/11/2008 1802    Imaging results:  Dg Chest 2 View  12/14/2013   CLINICAL DATA:  High blood pressure and chest pain.  EXAM: CHEST  2 VIEW  COMPARISON:  01/07/2011.  FINDINGS: Normal heart size and mediastinal contours. No acute infiltrate or edema. No effusion or pneumothorax. No acute osseous findings.  IMPRESSION: No active cardiopulmonary disease.   Electronically Signed   By: Jorje Guild M.D.   On: 12/14/2013 00:12    Other results: EKG: unchanged from previous tracings, nonspecific ST and T waves changes, ST elevation in V1-V4, T wave inversion in V5 and V6, anmd II, II and aVf (same as previous).  Assessment & Plan by Problem: Principal  Problem:   Hypertensive urgency Active Problems:   HYPERLIPIDEMIA   TOBACCO ABUSE   HYPERTENSION   CEREBROVASCULAR ACCIDENT, HX OF  #: Hypertensive urgency: Patient presents with significantly elevated blood pressure with initial blood pressure 210/150 mmHg in ED. He also had headache and mild chest pain initially, which all resolved after one dose of hydralazine 20 mg IV in ED. His blood pressure decreased to 180/104  mmHg after he received IV hydralazine. Patient does not have obvious signs of end organ damage, His creatinine is at baseline, chest pain resolved, no signs of new stroke. His blood pressure is symmetrical in both arms. His EKG is slightly worse than previous one.  - will admitted to telemetry bed for observation  - will slowly lower his blood pressure by approximately 25% in the first 6 hours to about SBP 160 mmHg. His blood pressure is 180/104 currently, will start oral HCTZ 25 mg daily and lisinopril 10 mg daily.  - prn hydralazine 10 mg for SBP>195  - trop q6h x 3, ASA, and prn NTG, repeat EKG in am - UDS   #: HYPERLIPIDEMIA: Not on med at home  -will check lipid profile today   #: HX OF CEREBROVASCULAR ACCIDENT: Patient had ischemic stroke 2009 with only deficit of right sided numbness. He does not have new symptoms on this admission.  - Continue his blood pressure  - May start statin if his LDL is high   # Substance Abuse: Alcohol and Tobacco - Consults to social work  - CIWA protocol  #: Scabies: he was diagnosed at health Department. He was given prescription, but has not picked medication up yet.  -will treat with permethrin 5% topically  # F/E/N  -NSL  -Will monitor electrolytes by checking BMP  -Diet: Heart health diet   # DVT px: Lovenox sq    Dispo: Disposition is deferred at this time, awaiting improvement of current medical problems. Anticipated discharge in approximately 1-2 day(s).   The patient does not have a current PCP (No Pcp Per Patient) and  does need an [facility] hospital follow-up appointment after discharge.  The patient does not have transportation limitations that hinder transportation to clinic appointments.  Signed: Marrion Coy, MD 12/14/2013, 2:30 AM

## 2013-12-14 NOTE — ED Notes (Signed)
Patient transported to CT 

## 2013-12-14 NOTE — Discharge Summary (Signed)
Name: Glenn Newton MRN: [id number] DOB: [birth year] 43 y.o. PCP: No Pcp Per Patient  Date of Admission: 12/13/2013 11:35 PM Date of Discharge: 12/14/2013 Attending Physician: Bartholomew Crews, MD  Discharge Diagnosis: Principal Problem:   Hypertensive urgency Active Problems:   HYPERLIPIDEMIA   TOBACCO ABUSE   HYPERTENSION   CEREBROVASCULAR ACCIDENT, HX OF   Scabies   Chest pain  Discharge Medications:   Medication List    STOP taking these medications       carvedilol 25 MG tablet  Commonly known as:  COREG      TAKE these medications       aspirin 81 MG EC tablet  Take 1 tablet (81 mg total) by mouth daily.     hydrochlorothiazide 25 MG tablet  Commonly known as:  HYDRODIURIL  Take 1 tablet (25 mg total) by mouth daily.     lovastatin 20 MG tablet  Commonly known as:  MEVACOR  Take 1 tablet (20 mg total) by mouth at bedtime.        Disposition and follow-up:   Glenn Newton was discharged from [facility] in Good condition.  At the hospital follow up visit please address:  1. Recheck BP, Compliance and affordably  of medications.  Application for insurance/orange card.  2.  Labs / imaging needed at time of follow-up: BMP  3.  Pending labs/ test needing follow-up: None  Follow-up Appointments:     Follow-up Information   Follow up with Clinton Gallant, MD On 12/20/2013. (at 2:45pm)    Specialty:  Internal Medicine   Contact information:   [place] [address] [phone]       Follow up with Darrall Dears, RN On 12/20/2013. (at 2:15 pm in internal medicine center for [facility] application (bring financial documents with you, W2, pay stubs, band account statments, identification, SS card))    Contact information:   [address] - [place] [address] [phone]       Discharge Instructions:   Consultations:    Procedures Performed:  Dg Chest 2 View  12/14/2013   CLINICAL DATA:  High blood pressure and chest pain.  EXAM:  CHEST  2 VIEW  COMPARISON:  01/07/2011.  FINDINGS: Normal heart size and mediastinal contours. No acute infiltrate or edema. No effusion or pneumothorax. No acute osseous findings.  IMPRESSION: No active cardiopulmonary disease.   Electronically Signed   By: Jorje Guild M.D.   On: 12/14/2013 00:12   Admission HPI: Glenn Newton is a 43 y.o. man with a h/o CCVA and HTN who presented to the ED 1 day after finding out his SBP was elevated to 205. He was seen in the health department for a skin rash in his groin on the day before the admission. Per the patient they tested him for Gonorrhea, syphilis and HIV. They said everything was negative, but that he had scabies. They also informed him that his BP was very elevated and that he needed to go to the hospital immediately. The patient refused this as he had to work. He went to work and had no problems. On the day of the admission, he went to work again. At work he was frustrated and became concerned about his BP being too high. He developed a tingling on the top of his head and mild 1/10 chest discomfort. The patient decided to leave work so that he could go to the ED. In the ED he was  found to have BP of 221/131. He was given 10 mg IV hydralazine and his symptoms completely resolved. IMTS was consulted for admission.  He denies drug use. Admits to drinking 12 pack on weekends, but no OH during the week.  Of note, he has been treated with coreg 25 mg BID for HTN. He stopped taking this medication about 2 years ago, and has been lost to follow up with [facility].  He denies HA blurry vision, changes in bowel or bladder functio, abdominal pain, and chest pain. Denies SOB.   Hospital Course by problem list: Hypertensive urgency  - Patient with longstanding hypertension presented to the ED for high blood pressure after bening referred by the health department.  On admission his BP was 221//131.  He was treated with IV hydralazine and started on 10mg  of Lisinopril and  25mg  ofHCTZ.  BP improved to 150s/100 in the morning AM and patinent remained asymptomatic. Troponins negative. He was discharged on Lisinopril 10mg , HCTZ 25mg  daily (both $4 medications)  With plan to follow up in the [facility] for ongoing adjustment of BP medications.  HYPERLIPIDEMIA  - Started Lovastatin on discharge.   TOBACCO ABUSE  - Discussed importance of cessation. Does not want assistance with NRT or medications at this time.   CEREBROVASCULAR ACCIDENT, HX OF  - Patient should be on ASA and statin for secondary prevention. Informed of this and prescribed Lovastatin ($4) on discharge.   Scabies  - Noted by Health Department, patient has Prescription at pharmacy, he was Tx with Primetherin cream while inpatient.    Discharge Vitals:   BP 159/105  Pulse 69  Temp(Src) 98.2 F (36.8 C) (Oral)  Resp 20  Ht 5\' 5"  (1.651 m)  Wt 177 lb 4.8 oz (80.423 kg)  BMI 29.50 kg/m2  SpO2 100%  Discharge Labs:  Results for orders placed during the hospital encounter of 12/13/13 (from the past 24 hour(s))  CBC WITH DIFFERENTIAL     Status: Abnormal   Collection Time    12/13/13 11:59 PM      Result Value Ref Range   WBC 4.5  4.0 - 10.5 K/uL   RBC 4.44  4.22 - 5.81 MIL/uL   Hemoglobin 12.9 (*) 13.0 - 17.0 g/dL   HCT 39.3  39.0 - 52.0 %   MCV 88.5  78.0 - 100.0 fL   MCH 29.1  26.0 - 34.0 pg   MCHC 32.8  30.0 - 36.0 g/dL   RDW 14.8  11.5 - 15.5 %   Platelets 231  150 - 400 K/uL   Neutrophils Relative % 45  43 - 77 %   Neutro Abs 2.0  1.7 - 7.7 K/uL   Lymphocytes Relative 39  12 - 46 %   Lymphs Abs 1.7  0.7 - 4.0 K/uL   Monocytes Relative 9  3 - 12 %   Monocytes Absolute 0.4  0.1 - 1.0 K/uL   Eosinophils Relative 6 (*) 0 - 5 %   Eosinophils Absolute 0.3  0.0 - 0.7 K/uL   Basophils Relative 1  0 - 1 %   Basophils Absolute 0.0  0.0 - 0.1 K/uL  BASIC METABOLIC PANEL     Status: Abnormal   Collection Time    12/13/13 11:59 PM      Result Value Ref Range   Sodium 140  137 - 147 mEq/L    Potassium 4.1  3.7 - 5.3 mEq/L   Chloride 105  96 - 112 mEq/L   CO2 26  19 - 32 mEq/L   Glucose, Bld 82  70 - 99 mg/dL   BUN 11  6 - 23 mg/dL   Creatinine, Ser 1.09  0.50 - 1.35 mg/dL   Calcium 9.1  8.4 - 10.5 mg/dL   GFR calc non Af Amer 82 (*) >90 mL/min   GFR calc Af Amer >90  >90 mL/min  TROPONIN I     Status: None   Collection Time    12/13/13 11:59 PM      Result Value Ref Range   Troponin I <0.30  <0.30 ng/mL  URINE RAPID DRUG SCREEN (HOSP PERFORMED)     Status: None   Collection Time    12/14/13  2:30 AM      Result Value Ref Range   Opiates NONE DETECTED  NONE DETECTED   Cocaine NONE DETECTED  NONE DETECTED   Benzodiazepines NONE DETECTED  NONE DETECTED   Amphetamines NONE DETECTED  NONE DETECTED   Tetrahydrocannabinol NONE DETECTED  NONE DETECTED   Barbiturates NONE DETECTED  NONE DETECTED  TROPONIN I     Status: None   Collection Time    12/14/13  2:52 AM      Result Value Ref Range   Troponin I <0.30  <0.30 ng/mL  HEPATIC FUNCTION PANEL     Status: Abnormal   Collection Time    12/14/13  4:45 AM      Result Value Ref Range   Total Protein 7.1  6.0 - 8.3 g/dL   Albumin 3.5  3.5 - 5.2 g/dL   AST 22  0 - 37 U/L   ALT 14  0 - 53 U/L   Alkaline Phosphatase 95  39 - 117 U/L   Total Bilirubin <0.2 (*) 0.3 - 1.2 mg/dL   Bilirubin, Direct <0.2  0.0 - 0.3 mg/dL   Indirect Bilirubin NOT CALCULATED  0.3 - 0.9 mg/dL  LIPID PANEL     Status: Abnormal   Collection Time    12/14/13  4:45 AM      Result Value Ref Range   Cholesterol 222 (*) 0 - 200 mg/dL   Triglycerides 180 (*) <150 mg/dL   HDL 70  >39 mg/dL   Total CHOL/HDL Ratio 3.2     VLDL 36  0 - 40 mg/dL   LDL Cholesterol 116 (*) 0 - 99 mg/dL  PROTIME-INR     Status: None   Collection Time    12/14/13  4:45 AM      Result Value Ref Range   Prothrombin Time 12.8  11.6 - 15.2 seconds   INR 0.98  0.00 - 99991111  BASIC METABOLIC PANEL     Status: None   Collection Time    12/14/13  4:45 AM      Result Value Ref  Range   Sodium 141  137 - 147 mEq/L   Potassium 3.8  3.7 - 5.3 mEq/L   Chloride 105  96 - 112 mEq/L   CO2 21  19 - 32 mEq/L   Glucose, Bld 96  70 - 99 mg/dL   BUN 10  6 - 23 mg/dL   Creatinine, Ser 0.88  0.50 - 1.35 mg/dL   Calcium 9.3  8.4 - 10.5 mg/dL   GFR calc non Af Amer >90  >90 mL/min   GFR calc Af Amer >90  >90 mL/min  TROPONIN I     Status: None   Collection Time    12/14/13  7:36 AM  Result Value Ref Range   Troponin I <0.30  <0.30 ng/mL  GLUCOSE, CAPILLARY     Status: None   Collection Time    12/14/13  7:48 AM      Result Value Ref Range   Glucose-Capillary 95  70 - 99 mg/dL   Comment 1 Documented in Chart    GLUCOSE, CAPILLARY     Status: Abnormal   Collection Time    12/14/13 11:31 AM      Result Value Ref Range   Glucose-Capillary 119 (*) 70 - 99 mg/dL   Comment 1 Documented in Chart      Signed: Joni Reining, DO 12/14/2013, 1:48 PM   Time Spent on Discharge: 35 minutes Services Ordered on Discharge: none Equipment Ordered on Discharge: none

## 2013-12-20 ENCOUNTER — Ambulatory Visit: Payer: Self-pay

## 2013-12-20 ENCOUNTER — Ambulatory Visit: Payer: Self-pay | Admitting: Internal Medicine

## 2013-12-21 ENCOUNTER — Encounter: Payer: Self-pay | Admitting: Internal Medicine

## 2013-12-21 ENCOUNTER — Ambulatory Visit: Payer: Self-pay

## 2013-12-21 ENCOUNTER — Ambulatory Visit (INDEPENDENT_AMBULATORY_CARE_PROVIDER_SITE_OTHER): Payer: Self-pay | Admitting: Internal Medicine

## 2013-12-21 VITALS — BP 152/102 | HR 69 | Temp 98.3°F | Ht 65.0 in | Wt 181.1 lb

## 2013-12-21 DIAGNOSIS — I10 Essential (primary) hypertension: Secondary | ICD-10-CM

## 2013-12-21 LAB — BASIC METABOLIC PANEL
BUN: 15 mg/dL (ref 6–23)
CALCIUM: 8.9 mg/dL (ref 8.4–10.5)
CO2: 25 mEq/L (ref 19–32)
Chloride: 105 mEq/L (ref 96–112)
Creat: 1.16 mg/dL (ref 0.50–1.35)
GLUCOSE: 94 mg/dL (ref 70–99)
Potassium: 3.7 mEq/L (ref 3.5–5.3)
SODIUM: 135 meq/L (ref 135–145)

## 2013-12-21 MED ORDER — LISINOPRIL 20 MG PO TABS: 20.0000 mg | ORAL_TABLET | Freq: Every day | ORAL | Status: DC

## 2013-12-21 NOTE — Patient Instructions (Addendum)
For your blood pressure : Increase the lisniopril to 20mg  daily (or 2 of the 10mg  pills)  Continue taking the HCTZ 25 mg daily   Will follow up with you in 2 wks to see what progress is done   General Instructions:   Thank you for bringing your medicines today. This helps [place] keep you safe from mistakes.   Progress Toward Treatment Goals:  No flowsheet data found.  Self Care Goals & Plans:  Self Care Goal 12/21/2013  Manage my medications take my medicines as prescribed; bring my medications to every visit; refill my medications on time  Eat healthy foods drink diet soda or water instead of juice or soda; eat more vegetables; eat foods that are low in salt; eat baked foods instead of fried foods    No flowsheet data found.   Care Management & Community Referrals:  No flowsheet data found.

## 2013-12-21 NOTE — Progress Notes (Signed)
   Subjective:    Patient ID: Glenn Newton, male    DOB: [birth year], 43 y.o.   MRN: [id number]  HPI Glenn Newton is a 43 yo man pmh as listed below presents for HFU.   Pt discharged on 12/14/13 for HTN urgency and scabies. Pt has insurance issues and therefore was given lisinopril 10mg  and HCTZ 25 mg daily which patient reports he was able to get and has been taking without significant noticeable side effects. The patient does report some anxiety about learning about his blood pressure readings and if the medication has "worked." The patient also rode his bicycle to this appointment and feels that that may be contributing to higher blood pressure readings. He continues to be asymptomatic denying headache, chest pain, dizziness, dyspnea on exertion, shortness of breath, or vision changes.   The patient is currently without insurance but was able to afford his medications and has a subsequent financial counselor appointment right after this visit today. The patient is also looking at getting independent supplemental insurance.  Past Medical History  Diagnosis Date  . Stroke 04/2008    left thalamus/posterior limb internal capsule and right parietal subcortical [place]illo matter.  . Hypertension   . Left ventricular hypertrophy   . Hypertension    Current Outpatient Prescriptions on File Prior to Visit  Medication Sig Dispense Refill  . aspirin EC 81 MG EC tablet Take 1 tablet (81 mg total) by mouth daily.  30 tablet  0  . hydrochlorothiazide (HYDRODIURIL) 25 MG tablet Take 1 tablet (25 mg total) by mouth daily.  30 tablet  0  . lovastatin (MEVACOR) 20 MG tablet Take 1 tablet (20 mg total) by mouth at bedtime.  30 tablet  0   No current facility-administered medications on file prior to visit.   Social, surgical, family history reviewed with patient and updated in appropriate chart locations.   Review of Systems  History obtained from chart review and the patient General ROS: negative for - chills,  fatigue, fever or weight gain Respiratory ROS: no cough, shortness of breath, or wheezing Cardiovascular ROS: no chest pain or dyspnea on exertion Neurological ROS: negative for - dizziness, headaches or visual changes     Objective:   Physical Exam Filed Vitals:   12/21/13 0846  BP: 152/102  Pulse: 69  Temp: 98.3 F (36.8 C)   General: sitting in chair, NAD HEENT: PERRL, EOMI, no scleral icterus Cardiac: RRR, no rubs, murmurs or gallops Pulm: clear to auscultation bilaterally, moving normal volumes of air Abd: soft, nontender, nondistended, BS present Ext: warm and well perfused, no pedal edema Neuro: alert and oriented X3, cranial nerves II-XII grossly intact    Assessment & Plan:  Please see problem oriented charting  Pt discussed with Dr. Ellwood Dense

## 2013-12-21 NOTE — Assessment & Plan Note (Signed)
BP Readings from Last 3 Encounters:  12/21/13 152/102  12/14/13 156/103  09/01/10 142/94    Lab Results  Component Value Date   NA 141 12/14/2013   K 3.8 12/14/2013   CREATININE 0.88 12/14/2013    Assessment: Blood pressure control:  same, poorly controlled Progress toward BP goal:   none Comments: pt did ride his bike to this appointment and reported some anxiety to learn of his BP readings  Plan: Medications:  increase lisinopril to 20mg  daily and HCTZ 25mg  Educational resources provided: brochure Self management tools provided:   Other plans: will check BMet at this visit, can consider maximizing the ACEi or adding chlorthalidone if continues to be elevated, smoking cessation and salt restriction were also stressed during this visit  F/u in 2 wks for recheck and to discuss labs

## 2013-12-25 NOTE — Progress Notes (Signed)
Case discussed with Dr. Sadek at the time of the visit.  We reviewed the resident's history and exam and pertinent patient test results.  I agree with the assessment, diagnosis, and plan of care documented in the resident's note. 

## 2014-01-04 ENCOUNTER — Ambulatory Visit (INDEPENDENT_AMBULATORY_CARE_PROVIDER_SITE_OTHER): Payer: Self-pay | Admitting: Internal Medicine

## 2014-01-04 ENCOUNTER — Encounter: Payer: Self-pay | Admitting: Internal Medicine

## 2014-01-04 VITALS — BP 136/95 | HR 61 | Temp 97.3°F | Ht 65.0 in | Wt 176.2 lb

## 2014-01-04 DIAGNOSIS — B372 Candidiasis of skin and nail: Secondary | ICD-10-CM | POA: Insufficient documentation

## 2014-01-04 DIAGNOSIS — R21 Rash and other nonspecific skin eruption: Secondary | ICD-10-CM | POA: Insufficient documentation

## 2014-01-04 DIAGNOSIS — I10 Essential (primary) hypertension: Secondary | ICD-10-CM

## 2014-01-04 MED ORDER — TRIAMCINOLONE 0.1 % CREAM:EUCERIN CREAM 1:1: 1.0000 "application " | TOPICAL_CREAM | Freq: Three times a day (TID) | CUTANEOUS | Status: DC

## 2014-01-04 MED ORDER — HYDROCHLOROTHIAZIDE 25 MG PO TABS: 25.0000 mg | ORAL_TABLET | Freq: Every day | ORAL | Status: DC

## 2014-01-04 MED ORDER — LISINOPRIL 10 MG PO TABS: 10.0000 mg | ORAL_TABLET | Freq: Every day | ORAL | Status: DC

## 2014-01-04 MED ORDER — NYSTATIN 100000 UNIT/GM EX POWD: Freq: Four times a day (QID) | CUTANEOUS | Status: DC

## 2014-01-04 NOTE — Assessment & Plan Note (Addendum)
Groin lesion is acute and erythematous, macerated plaques and erosions with some satellite lesions in groin folds bilaterally. Pt having no penile lesions or drainage.  -RPR and recent HIV neg -nystatin powder for groin lesions -educated on NO use of medium potency steriod creams in genital area -f/u in 2 wks If doesn't improve consider topical antifungal

## 2014-01-04 NOTE — Assessment & Plan Note (Addendum)
Pt describes an evolving rash over a period of 6 months along with a new acute rash in his groin. These 2 rashes seems very different in appearance and onset. ddx include psoriasis given some elbow and plaque like appearance (no nail lesions or joint symptoms and no family hx of AI disease), nummular eczema given quarter size circumferential lesions on achilles area, secondary syphilis less likely given no palm/sole involvement, or systemic dermatophyte. -KOH prep of the right inner thigh lesion that was created and analyzed by this Sonni Barse revealed no fungal element or budding yeast to suggest candida or dermatophyte as etiology -Kenalog 0.1% cream BID on thighs  -f/u in 2 wks - educated on signs that rash was worsening or systemic signs of infection appear to come back for re-evaluation

## 2014-01-04 NOTE — Patient Instructions (Addendum)
General Instructions: 1. For your blood pressure: we will decrease lisinopril 10mg  daily and continue the HCTZ 25mg  daily. If you continue to have bad dizzy spells you can stop the lisinopril.   2. For your rash: the ones on your legs and arms you can use the Kenalog cream twice a day and up to three times a day. Also OTC Eucerin lotion.   3. For your other rash: avoid using the kenalog cream. If it continues to itch you could use OTC hydrocortisone cream for a short time. Use the nystatin powder up to 4 times a day.   If at any point the rashes get worse, you get a fever, or the rash starts to get hot/warm or ooze anything come in to be seen.   Please bring your medicines with you each time you come to clinic.  Medicines may include prescription medications, over-the-counter medications, herbal remedies, eye drops, vitamins, or other pills.   Progress Toward Treatment Goals:  No flowsheet data found.  Self Care Goals & Plans:  Self Care Goal 01/04/2014  Manage my medications bring my medications to every visit; take my medicines as prescribed; refill my medications on time  Eat healthy foods eat more vegetables; eat foods that are low in salt; eat baked foods instead of fried foods    No flowsheet data found.   Care Management & Community Referrals:  No flowsheet data found.

## 2014-01-04 NOTE — Assessment & Plan Note (Signed)
BP Readings from Last 3 Encounters:  01/04/14 136/95  12/21/13 152/102  12/14/13 156/103    Lab Results  Component Value Date   NA 135 12/21/2013   K 3.7 12/21/2013   CREATININE 1.16 12/21/2013    Assessment: Blood pressure control:  at goal Progress toward BP goal:   at goal Comments: pt ran out of lisinopril 2 days ago   Plan: Medications:  lisinopril 10mg  and HCTZ 25mg   Educational resources provided:   Self management tools provided:   Other plans: recheck in 2 wks when evaluating rash

## 2014-01-04 NOTE — Progress Notes (Signed)
I saw and evaluated the patient.  I personally confirmed the key portions of the history and exam documented by Dr. Randie Heinz  at the time of the visit.  We reviewed the resident's history and exam and pertinent patient test results.  I agree with the assessment, diagnosis, and plan of care documented in the resident's note.

## 2014-01-04 NOTE — Progress Notes (Signed)
   Subjective:    Patient ID: Glenn Newton, male    DOB: 16-Feb-1971, 43 y.o.   MRN: [id number]  HPI Mr. Highley is a 43 yo man pmh as listed below presents for HTN recheck.   Recent increase in his lisinopril and HCTZ were made and the patient reports compliance. Since that time he has not had CP, SOB, DOE, LE edema, HA, or blurry vision.   During complete ROS pt complains of puritic rash on hands, arms, legs, and groin. Pt states lesions on legs started about 6 months ago and has started to spread down his legs. He was recently diagnosed with scabies and had complete resolution of symptoms on hands and arms after completing permetherin cream.  He denied any hx of STI or recent unprotected sex with any new partners. Pt denied any penile ulcerations, penile discharge, dysuria, pyuria, nausea/vomiting/diarrhea, fever or chills. The groin lesions started about 1 wk ago and is puruitic in nature and soothed with OTC antihistamine cream. He doesn't have any hx of oral ulcers, sun sensitivity, dry mouth or eyes, and no joint pain/swelling along with no family hx of AI disease.   Past Medical History  Diagnosis Date  . Stroke 04/2008    left thalamus/posterior limb internal capsule and right parietal subcortical [place] matter.  . Hypertension   . Left ventricular hypertrophy   . Hypertension    Current Outpatient Prescriptions on File Prior to Visit  Medication Sig Dispense Refill  . aspirin EC 81 MG EC tablet Take 1 tablet (81 mg total) by mouth daily.  30 tablet  0  . lovastatin (MEVACOR) 20 MG tablet Take 1 tablet (20 mg total) by mouth at bedtime.  30 tablet  0   No current facility-administered medications on file prior to visit.   Social, surgical, family history reviewed with patient and updated in appropriate chart locations.   Review of Systems Negative except as listed in HPI     Objective:   Physical Exam  Skin:     Rash distribution    Filed Vitals:   01/04/14 0822  BP:  136/95  Pulse: 61  Temp: 97.3 F (36.3 C)   General: sitting in chair, NAD HEENT: PERRL, EOMI, no scleral icterus Cardiac: RRR, no rubs, murmurs or gallops Pulm: clear to auscultation bilaterally, no crackles, wheezes, or rhonchi, moving normal volumes of air Abd: soft, nontender, nondistended, BS present Ext: warm and well perfused, no pedal edema, several nummular (penny to quarter size) lesions along inner thighs and back of achilles tendon bilaterally with some central scaling, no surrounding erythema or frank drainage from any of the lesions, no palmer or sole involvement, some hyperpigmented lesions along elbows bilaterally, no scalp alopecia or lesions, no nail pitting or scaling GU: erythematous, macerated plaques and erosions with satellite lesions along dorsum of penis, surrounding scrotum and into groin folds bilaterally, no penile discharge or ulcerations Neuro: alert and oriented X3, cranial nerves II-XII grossly intact    Assessment & Plan:  Please see problem oriented charting  Pt discussed with Dr. Marinda Elk

## 2014-01-17 ENCOUNTER — Other Ambulatory Visit: Payer: Self-pay | Admitting: *Deleted

## 2014-01-17 MED ORDER — TRIAMCINOLONE 0.1 % CREAM:EUCERIN CREAM 1:1: 1.0000 "application " | TOPICAL_CREAM | Freq: Three times a day (TID) | CUTANEOUS | Status: DC

## 2014-01-18 ENCOUNTER — Ambulatory Visit: Payer: Self-pay | Admitting: Internal Medicine

## 2014-01-19 ENCOUNTER — Telehealth: Payer: Self-pay | Admitting: Internal Medicine

## 2014-01-19 NOTE — Telephone Encounter (Signed)
Call was made into patient's pharmacy in regards to fax received to clarify kenalog cream prescription. Clarification was made and prescription should be available for the patient.   Clinton Gallant, MD PGY3

## 2014-01-24 ENCOUNTER — Ambulatory Visit: Payer: Self-pay | Admitting: Internal Medicine

## 2014-02-06 ENCOUNTER — Encounter (HOSPITAL_COMMUNITY): Payer: Self-pay | Admitting: Emergency Medicine

## 2014-02-06 ENCOUNTER — Observation Stay (HOSPITAL_COMMUNITY)
Admission: EM | Admit: 2014-02-06 | Discharge: 2014-02-08 | Disposition: A | Discharge status: Home / Self Care | Payer: Self-pay | Attending: Internal Medicine | Admitting: Internal Medicine

## 2014-02-06 ENCOUNTER — Emergency Department (HOSPITAL_COMMUNITY): Payer: Self-pay

## 2014-02-06 DIAGNOSIS — E7800 Pure hypercholesterolemia, unspecified: Secondary | ICD-10-CM | POA: Insufficient documentation

## 2014-02-06 DIAGNOSIS — G459 Transient cerebral ischemic attack, unspecified: Principal | ICD-10-CM | POA: Diagnosis present

## 2014-02-06 DIAGNOSIS — R200 Anesthesia of skin: Secondary | ICD-10-CM

## 2014-02-06 DIAGNOSIS — R42 Dizziness and giddiness: Secondary | ICD-10-CM | POA: Insufficient documentation

## 2014-02-06 DIAGNOSIS — Z79899 Other long term (current) drug therapy: Secondary | ICD-10-CM | POA: Insufficient documentation

## 2014-02-06 DIAGNOSIS — E785 Hyperlipidemia, unspecified: Secondary | ICD-10-CM

## 2014-02-06 DIAGNOSIS — R209 Unspecified disturbances of skin sensation: Secondary | ICD-10-CM | POA: Insufficient documentation

## 2014-02-06 DIAGNOSIS — B354 Tinea corporis: Secondary | ICD-10-CM

## 2014-02-06 DIAGNOSIS — Z87891 Personal history of nicotine dependence: Secondary | ICD-10-CM | POA: Insufficient documentation

## 2014-02-06 DIAGNOSIS — I10 Essential (primary) hypertension: Secondary | ICD-10-CM | POA: Diagnosis present

## 2014-02-06 DIAGNOSIS — Z7982 Long term (current) use of aspirin: Secondary | ICD-10-CM | POA: Insufficient documentation

## 2014-02-06 HISTORY — DX: Transient cerebral ischemic attack, unspecified: G45.9

## 2014-02-06 HISTORY — DX: Pure hypercholesterolemia, unspecified: E78.00

## 2014-02-06 LAB — I-STAT CHEM 8, ED
BUN: 12 mg/dL (ref 6–23)
Calcium, Ion: 1.21 mmol/L (ref 1.12–1.23)
Chloride: 103 mEq/L (ref 96–112)
Creatinine, Ser: 1.3 mg/dL (ref 0.50–1.35)
Glucose, Bld: 98 mg/dL (ref 70–99)
HCT: 42 % (ref 39.0–52.0)
Hemoglobin: 14.3 g/dL (ref 13.0–17.0)
Potassium: 4.1 mEq/L (ref 3.7–5.3)
SODIUM: 138 meq/L (ref 137–147)
TCO2: 25 mmol/L (ref 0–100)

## 2014-02-06 LAB — URINALYSIS, ROUTINE W REFLEX MICROSCOPIC
Bilirubin Urine: NEGATIVE
GLUCOSE, UA: NEGATIVE mg/dL
Hgb urine dipstick: NEGATIVE
Ketones, ur: NEGATIVE mg/dL
Nitrite: NEGATIVE
PH: 5.5 (ref 5.0–8.0)
Protein, ur: NEGATIVE mg/dL
Specific Gravity, Urine: 1.022 (ref 1.005–1.030)
Urobilinogen, UA: 1 mg/dL (ref 0.0–1.0)

## 2014-02-06 LAB — URINE MICROSCOPIC-ADD ON

## 2014-02-06 LAB — RAPID URINE DRUG SCREEN, HOSP PERFORMED
AMPHETAMINES: NOT DETECTED
BENZODIAZEPINES: NOT DETECTED
Barbiturates: NOT DETECTED
Cocaine: NOT DETECTED
OPIATES: NOT DETECTED
Tetrahydrocannabinol: NOT DETECTED

## 2014-02-06 LAB — CBC WITH DIFFERENTIAL/PLATELET
Basophils Absolute: 0 10*3/uL (ref 0.0–0.1)
Basophils Relative: 0 % (ref 0–1)
Eosinophils Absolute: 0.2 10*3/uL (ref 0.0–0.7)
Eosinophils Relative: 4 % (ref 0–5)
HCT: 38.6 % — ABNORMAL LOW (ref 39.0–52.0)
Hemoglobin: 12.7 g/dL — ABNORMAL LOW (ref 13.0–17.0)
LYMPHS ABS: 1.5 10*3/uL (ref 0.7–4.0)
Lymphocytes Relative: 34 % (ref 12–46)
MCH: 28.6 pg (ref 26.0–34.0)
MCHC: 32.9 g/dL (ref 30.0–36.0)
MCV: 86.9 fL (ref 78.0–100.0)
Monocytes Absolute: 0.5 10*3/uL (ref 0.1–1.0)
Monocytes Relative: 12 % (ref 3–12)
NEUTROS PCT: 50 % (ref 43–77)
Neutro Abs: 2.2 10*3/uL (ref 1.7–7.7)
PLATELETS: 234 10*3/uL (ref 150–400)
RBC: 4.44 MIL/uL (ref 4.22–5.81)
RDW: 14.1 % (ref 11.5–15.5)
WBC: 4.3 10*3/uL (ref 4.0–10.5)

## 2014-02-06 MED ORDER — HYDRALAZINE HCL 20 MG/ML IJ SOLN
5.0000 mg | Freq: Three times a day (TID) | INTRAMUSCULAR | Status: DC | PRN
  Administered 2014-02-07 (×2): 5 mg via INTRAVENOUS
  Filled 2014-02-06 (×2): qty 1

## 2014-02-06 MED ORDER — LISINOPRIL 10 MG PO TABS
10.0000 mg | ORAL_TABLET | Freq: Every day | ORAL | Status: DC
  Filled 2014-02-06: qty 1

## 2014-02-06 MED ORDER — ACETAMINOPHEN 325 MG PO TABS: 650.0000 mg | ORAL_TABLET | ORAL | Status: DC | PRN

## 2014-02-06 MED ORDER — ASPIRIN EC 81 MG PO TBEC
81.0000 mg | DELAYED_RELEASE_TABLET | Freq: Every day | ORAL | Status: DC
  Administered 2014-02-07: 81 mg via ORAL
  Filled 2014-02-06: qty 1

## 2014-02-06 MED ORDER — HYDROCHLOROTHIAZIDE 25 MG PO TABS
25.0000 mg | ORAL_TABLET | Freq: Every day | ORAL | Status: DC
  Filled 2014-02-06: qty 1

## 2014-02-06 MED ORDER — ENOXAPARIN SODIUM 40 MG/0.4ML ~~LOC~~ SOLN
40.0000 mg | Freq: Every day | SUBCUTANEOUS | Status: DC
  Filled 2014-02-06: qty 0.4

## 2014-02-06 MED ORDER — HYDRALAZINE HCL 20 MG/ML IJ SOLN: 5.0000 mg | Freq: Once | INTRAMUSCULAR | Status: DC

## 2014-02-06 MED ORDER — SIMVASTATIN 10 MG PO TABS
10.0000 mg | ORAL_TABLET | Freq: Every day | ORAL | Status: DC
Start: 2014-02-07 — End: 2014-02-07
  Filled 2014-02-06: qty 1

## 2014-02-06 MED ORDER — LORAZEPAM 1 MG PO TABS
1.0000 mg | ORAL_TABLET | Freq: Four times a day (QID) | ORAL | Status: AC | PRN
  Administered 2014-02-07: 1 mg via ORAL
  Filled 2014-02-06: qty 1

## 2014-02-06 MED ORDER — CLOTRIMAZOLE 1 % EX CREA
TOPICAL_CREAM | Freq: Two times a day (BID) | CUTANEOUS | Status: DC
  Administered 2014-02-07 – 2014-02-08 (×2): via TOPICAL
  Filled 2014-02-06 (×2): qty 15

## 2014-02-06 NOTE — ED Notes (Signed)
Per EMS: Pt at work when he had two episodes of right sided numbness/tingling to face, arm and leg. Pt states with the second episode he "felt like I was going to faint." PT reports diaphoresis with episode but denies confusion, slurred speech, facial droop or weakness. Pt now denies any complaint. Neuro intact, NIH 0. Hx: CVA 2009

## 2014-02-06 NOTE — ED Notes (Signed)
Contacted MRI about wait time. Pt made aware.

## 2014-02-06 NOTE — H&P (Signed)
Date: 02/07/2014               Patient Name:  Glenn Newton MRN: [id number]  DOB: 1971/01/08 Age / Sex: 43 y.o., male   PCP: No Pcp Per Patient         Medical Service: Internal Medicine Teaching Service         Attending Physician: Dr. Madilyn Fireman, MD    First Contact: Dr. Arcelia Jew Pager: [phone]  Second Contact: Dr. Algis Liming Pager: [phone]       After Hours (After 5p/  First Contact Pager: [phone]  weekends / holidays): Second Contact Pager: [phone]   Chief Complaint: numbness and lightheadedness  History of Present Illness: Glenn Newton is a 43 yo male with PMHx of CVA in 2009, HTN, HLD and tobacco abuse who presents to the ED with complaint of numbness and lightheadedness. Pt states he went to work at 4 pm today. When he arrived, he had a brief episode of numbness and tingling in his right upper and lower extremity. The numbness quickly resolved. 5 minutes later, the numbness and tingling returned and also included the right side of his mouth. He felt sweaty, dizzy and lightheaded and thought he might pass out. This episode lasted for several seconds and then resolved on its own. His supervisor called EMS. Pt admits to headache and right sided weakness, but denies any vision or hearing loss, no facial droop or trouble speaking. He did not have a syncopal episode. Pt states his symptoms resolved in a few seconds, but he still has some numbness and tingling in his right foot. Pt has a history of CVA in 2009, which he states his symptoms were similar in nature at the time with right sided numbness and tingling. His only deficit from the CVA was numbness in his 2nd finger in his right hand. Pt has been taking all of his medications as prescribed, but he ran out of his ASA 81 mg 2 weeks ago and has been substituting it with ASA 325 mg daily.   Meds: Current Facility-Administered Medications  Medication Dose Route Frequency Provider Last Rate Last Dose  . acetaminophen (TYLENOL) tablet 650 mg   650 mg Oral Q4H PRN Corky Sox, MD      . aspirin EC tablet 81 mg  81 mg Oral Daily Corky Sox, MD      . clotrimazole (LOTRIMIN) 1 % cream   Topical BID Corky Sox, MD      . enoxaparin (LOVENOX) injection 40 mg  40 mg Subcutaneous Daily Corky Sox, MD      . hydrALAZINE (APRESOLINE) injection 5 mg  5 mg Intravenous Q8H PRN Osa Craver, MD   5 mg at 02/07/14 0013  . hydrochlorothiazide (HYDRODIURIL) tablet 25 mg  25 mg Oral Daily Corky Sox, MD      . lisinopril (PRINIVIL,ZESTRIL) tablet 10 mg  10 mg Oral Daily Corky Sox, MD      . LORazepam (ATIVAN) tablet 1 mg  1 mg Oral Q6H PRN Corky Sox, MD      . simvastatin (ZOCOR) tablet 10 mg  10 mg Oral q1800 Corky Sox, MD        Allergies: Allergies as of 02/06/2014  . (No Known Allergies)   Past Medical History  Diagnosis Date  . Stroke 04/2008    left thalamus/posterior limb internal capsule and right parietal subcortical [place]k matter.  . Hypertension   . Left ventricular hypertrophy   .  Hypertension    History reviewed. No pertinent past surgical history. Family History  Problem Relation Age of Onset  . Stroke       first-degree relative less than 60   History   Social History  . Marital Status: Legally Separated    Spouse Name: N/A    Number of Children: N/A  . Years of Education: N/A   Occupational History  .  unemployed for 2 years    Social History Main Topics  . Smoking status: Former Smoker -- 0.50 packs/day    Types: Cigarettes    Quit date: 01/15/2014  . Smokeless tobacco: Not on file     Comment: 3 cigs per day  . Alcohol Use: Yes     Comment: socially   . Drug Use: No  . Sexual Activity: Not on file   Other Topics Concern  . Not on file   Social History Narrative    Used to work in a warehouse currently drawing unemployment.       High school education.       Married but happily separated.       A total of 6 kids 2 from previous relationship and 4 with wife.    Review of  Systems: General: Admits to diaphoresis. Denies fever, chills, appetite change and fatigue.  Respiratory: Denies SOB, DOE, cough, chest tightness, and wheezing.   Cardiovascular: Denies chest pain and palpitations.  Gastrointestinal: Denies nausea, vomiting, abdominal pain, diarrhea, constipation, blood in stool and abdominal distention.  Genitourinary: Denies dysuria, urgency, frequency, hematuria, and flank pain. Endocrine: Denies hot or cold intolerance, polyuria, and polydipsia. Musculoskeletal: Denies myalgias, back pain, joint swelling, arthralgias and gait problem.  Skin: Admits to itching and skin lesions on lower extremities bilaterally. Denies pallor, and wounds.  Neurological: Admits to dizziness, weakness, lightheadedness, numbness and headache. Denies seizures, syncope. Psychiatric/Behavioral: Denies mood changes, confusion, nervousness, sleep disturbance and agitation.  Physical Exam: Filed Vitals:   02/06/14 2015 02/06/14 2200 02/06/14 2301 02/07/14 0007  BP: 154/105  177/122 184/116  Pulse: 57  56   Temp:  98.5 F (36.9 C) 98 F (36.7 C)   TempSrc:   Oral   Resp: 15  18   Height:   5\' 5"  (1.651 m)   Weight:   172 lb 9.6 oz (78.291 kg)   SpO2: 100%  100%    General: Vital signs reviewed.  Patient is a well-developed and well-nourished, in no acute distress and cooperative with exam.  Head: Normocephalic and atraumatic. Eyes: PERRL, EOMI, conjunctivae normal, No scleral icterus.  Neck: Supple, trachea midline, normal ROM, No JVD, masses, thyromegaly.  Cardiovascular: RRR, S1 normal, S2 normal, no murmurs, gallops, or rubs, no carotid bruit. Pulmonary/Chest: Clear to ausculation bilaterally, no wheezes, rales, or rhonchi. Abdominal: Soft, non-tender, non-distended, BS +, no masses, organomegaly, or guarding present.  Musculoskeletal: No joint deformities, erythema, or stiffness, ROM full and nontender. Extremities: No lower extremity edema bilaterally,  pulses symmetric  and intact bilaterally. No cyanosis or clubbing. Neurological: A&O x3, Strength is normal and symmetric bilaterally, cranial nerve II-XII are grossly intact, no focal motor deficit, sensory intact to light touch bilaterally, EOMI.  Skin: Pt has several small lesions on his lower extremities bilaterally. The look chronic and scabbed over. 2 larger lesions on medial side of left lower extremity and lateral side of right lower extremity. Lesions are 2 cm in diameter with Reinitz, raised scaling skin change. Warm, dry and intact. No  erythema. Psychiatric: Normal mood and  affect. speech and behavior is normal. Cognition and memory are normal.   Lab results: Basic Metabolic Panel:  Recent Labs  02/06/14 1825  NA 138  K 4.1  CL 103  GLUCOSE 98  BUN 12  CREATININE 1.30   CBC:  Recent Labs  02/06/14 1809 02/06/14 1825  WBC 4.3  --   NEUTROABS 2.2  --   HGB 12.7* 14.3  HCT 38.6* 42.0  MCV 86.9  --   PLT 234  --    Urine Drug Screen: Drugs of Abuse     Component Value Date/Time   LABOPIA NONE DETECTED 02/06/2014 1944   COCAINSCRNUR NONE DETECTED 02/06/2014 1944   LABBENZ NONE DETECTED 02/06/2014 1944   AMPHETMU NONE DETECTED 02/06/2014 1944   THCU NONE DETECTED 02/06/2014 1944   LABBARB NONE DETECTED 02/06/2014 1944    Urinalysis:  Recent Labs  02/06/14 1944  Walters 1.022  PHURINE 5.5  GLUCOSEU NEGATIVE  HGBUR NEGATIVE  BILIRUBINUR NEGATIVE  KETONESUR NEGATIVE  PROTEINUR NEGATIVE  UROBILINOGEN 1.0  NITRITE NEGATIVE  LEUKOCYTESUR TRACE*    Assessment & Plan by Problem:  TIA: Pt presents with headache, numbness and tingling in right extremities, and the right side of his mouth associated with diaphoresis, dizziness and lightheadedness. All symptoms resolved within the span of several seconds with some residual numbness in right foot. Vital signs on admission show afebrile T of 98.6, BP 163/104, HR 61, RR 18, Pulse ox 100% on room air. Labs are WNL. Pt has  a history of CVA in September of 2009. MRI/MRA showed two foci of acute infarction, the larger in the left  thalamus/posterior limb internal capsule and the smaller in the right parietal subcortical Mackins matter. Diffuse intracranial medium sized vessel atherosclerosis with focal stenosis of the left A1 segment. Considerable atherosclerotic irregularity of the basilar artery but without a dominant focal stenosis. Pt had residual numbness in 2nd finger of right hand after his CVA. Pt did not receive CT head w/o contrast in the ED. Dr. Leonel Ramsay, neurology, agreed with this decision since he was back at baseline. Pt went for MRI, but d/t claustrophobia, was unable to complete. -MRI/MRA tomorrow with Ativan 1 mg for anxiety -Neurology consult -ASA 81 mg daily -PT/OT -Neuro checks with vital signs  Tinea Corporis: Pt complains of bilateral lesions on lower extremities. Multiple scab like lesions on proximal and distal anterior and medial areas, but also 2 larger lesions on the medial side of left lower extremity and lateral side of right lower extremity. Lesions are 2 cm in diameter with Rastetter, raised scaling skin change. Pt states these are itchy only after he takes a hot shower. He was previously diagnosed with scabies in June 2015 and given permethrin cream. He states this helped, but he ran out. -Clotrimazole 1% BID  HTN: BP 163/104 on admission.  -HCTZ 25 mg daily -Lisinopril 10 mg daily -Hydralazine 5 mg IV Q8H prn SBP>180 or DBP >110  HLD: Most recent lipid panel 12/14/13 shows cholesterol 222, TG 180, HDL 70, LDL 116. -Simvastatin 10 mg daily  DVT: Lovenox 40 mg SQ daily  Dispo: Disposition is deferred at this time, awaiting improvement of current medical problems. Anticipated discharge in approximately 1-2 day(s).   The patient does have a current PCP (No Pcp Per Patient) and does need an [facility] hospital follow-up appointment after discharge.  The patient does not have transportation  limitations that hinder transportation to clinic appointments.  Signed: Osa Craver, DO PGY-1 Internal Medicine  Resident Pager # [phone] 02/07/2014 12:19 AM

## 2014-02-06 NOTE — ED Notes (Signed)
Pt unable to tolerate MRI dt claustrophobia. PA Idol made aware.

## 2014-02-06 NOTE — ED Provider Notes (Signed)
CSN: [id number]     Arrival date & time 02/06/14  1647 History   First MD Initiated Contact with Patient 02/06/14 1652     Chief Complaint  Patient presents with  . Stroke Symptoms     (Consider location/radiation/quality/duration/timing/severity/associated sxs/prior Treatment) HPI  Glenn Newton is a 43 y.o. male with a history significant for cva, and hypertension presenting with 2 brief episodes of right sided numbness which included his arm, leg and the right side of his mouth.  He describes riding his bike to work which he does every day,  Had just clocked into his shift when he developed numbness as described, which lasted about a minute,  Resolved, then returned 5 minutes later.  With the second episode also lasting briefly but more intense and accompanied by lightheadedness. His symptoms are completely resolved and were not accompanied by chest pain,  Palpitations, visual changes or headache.  He does endorse symptoms similar to his previous stroke.     Past Medical History  Diagnosis Date  . Hypertension   . Left ventricular hypertrophy   . Hypertension   . High cholesterol   . TIA (transient ischemic attack) 02/06/2014  . Stroke 04/2008    left thalamus/posterior limb internal capsule and right parietal subcortical [place]on matter.   Past Surgical History  Procedure Laterality Date  . No past surgeries     Family History  Problem Relation Age of Onset  . Stroke       first-degree relative less than 60   History  Substance Use Topics  . Smoking status: Former Smoker -- 2.00 packs/day for 23 years    Types: Cigarettes    Quit date: 01/14/2014  . Smokeless tobacco: Never Used  . Alcohol Use: 5.4 oz/week    9 Cans of beer per week    Review of Systems  Constitutional: Negative for fever.  HENT: Negative for congestion and sore throat.   Eyes: Negative.   Respiratory: Negative for chest tightness and shortness of breath.   Cardiovascular: Negative for chest pain.   Gastrointestinal: Negative for nausea and abdominal pain.  Genitourinary: Negative.   Musculoskeletal: Negative for arthralgias, joint swelling and neck pain.  Skin: Negative.  Negative for rash and wound.  Neurological: Positive for light-headedness and numbness. Negative for dizziness, weakness and headaches.  Psychiatric/Behavioral: Negative.       Allergies  Review of patient's allergies indicates no known allergies.  Home Medications   Prior to Admission medications   Medication Sig Start Date End Date Taking? Authorizing Provider  aspirin 81 MG EC tablet Take 81 mg by mouth daily. 12/14/13  Yes Joni Reining, DO  hydrochlorothiazide (HYDRODIURIL) 25 MG tablet Take 1 tablet (25 mg total) by mouth daily. 01/04/14  Yes Clinton Gallant, MD  lisinopril (PRINIVIL,ZESTRIL) 10 MG tablet Take 1 tablet (10 mg total) by mouth daily. 01/04/14  Yes Clinton Gallant, MD  lovastatin (MEVACOR) 20 MG tablet Take 20 mg by mouth daily. 12/14/13  Yes Joni Reining, DO   BP 149/95  Pulse 60  Temp(Src) 98.1 F (36.7 C) (Oral)  Resp 17  Ht 5\' 5"  (1.651 m)  Wt 172 lb 9.6 oz (78.291 kg)  BMI 28.72 kg/m2  SpO2 100% Physical Exam  Nursing note and vitals reviewed. Constitutional: He is oriented to person, place, and time. He appears well-developed and well-nourished.  HENT:  Head: Normocephalic and atraumatic.  Eyes: Conjunctivae are normal.  Neck: Normal range of motion.  Cardiovascular: Normal rate, regular rhythm, normal heart  sounds and intact distal pulses.   Pulmonary/Chest: Effort normal and breath sounds normal. He has no wheezes.  Abdominal: Soft. Bowel sounds are normal. There is no tenderness.  Musculoskeletal: Normal range of motion.  Neurological: He is alert and oriented to person, place, and time. He has normal strength. No cranial nerve deficit or sensory deficit. Coordination and gait normal. GCS eye subscore is 4. GCS verbal subscore is 5. GCS motor subscore is 6.  Reflex Scores:      Tricep  reflexes are 2+ on the right side and 2+ on the left side.      Bicep reflexes are 2+ on the right side and 2+ on the left side.      Brachioradialis reflexes are 2+ on the right side and 2+ on the left side.      Patellar reflexes are 2+ on the right side and 2+ on the left side.      Achilles reflexes are 2+ on the right side and 2+ on the left side. No pronator drift.    Skin: Skin is warm and dry.  Psychiatric: He has a normal mood and affect.    ED Course  Procedures (including critical care time) Labs Review Labs Reviewed  CBC WITH DIFFERENTIAL - Abnormal; Notable for the following:    Hemoglobin 12.7 (*)    HCT 38.6 (*)    All other components within normal limits  URINALYSIS, ROUTINE W REFLEX MICROSCOPIC - Abnormal; Notable for the following:    Leukocytes, UA TRACE (*)    All other components within normal limits  GLUCOSE, CAPILLARY - Abnormal; Notable for the following:    Glucose-Capillary 122 (*)    All other components within normal limits  URINE RAPID DRUG SCREEN (HOSP PERFORMED)  URINE MICROSCOPIC-ADD ON  HEMOGLOBIN A1C  I-STAT CHEM 8, ED    Imaging Review Mr Brain Wo Contrast  02/07/2014   CLINICAL DATA:  Stroke.  Hypertension and smoking  EXAM: MRI HEAD WITHOUT CONTRAST  MRA HEAD WITHOUT CONTRAST  TECHNIQUE: Multiplanar, multiecho pulse sequences of the brain and surrounding structures were obtained without intravenous contrast. Angiographic images of the head were obtained using MRA technique without contrast.  COMPARISON:  MRI 04/12/2008  FINDINGS: MRI HEAD FINDINGS  Negative for acute infarct.  Age advanced chronic microvascular ischemic changes have progressed since 2009. Multiple areas of chronic infarct are present throughout the cerebral Dumais matter involving the subcortical and deep Chavers matter. Chronic infarcts in the thalami bilaterally. Small focal chronic lacune in the left pons. Chronic micro hemorrhage in the right thalamus. No cortical infarct.   Negative for mass lesion. Ventricle size is normal. Cerebral volume is normal.  MRA HEAD FINDINGS  Both vertebral arteries are patent to the basilar. The basilar is widely patent. Superior cerebellar and posterior cerebral arteries are patent. Patent posterior communicating artery bilaterally with fetal origin of the left posterior cerebral artery on the left.  Internal carotid artery is patent bilaterally. Anterior and middle cerebral arteries are patent bilaterally. Atherosclerotic disease in the left A1 segment. Scattered areas of mild stenosis are present in the middle cerebral arteries bilaterally.  Negative for cerebral aneurysm.  IMPRESSION: Negative for acute infarct.  Age advanced chronic microvascular ischemic changes with progression since 2009.  Mild intracranial atherosclerotic disease.   Electronically Signed   By: Franchot Gallo M.D.   On: 02/07/2014 11:16   Mr Jodene Nam Head/brain Wo Cm  02/07/2014   CLINICAL DATA:  Stroke.  Hypertension and smoking  EXAM:  MRI HEAD WITHOUT CONTRAST  MRA HEAD WITHOUT CONTRAST  TECHNIQUE: Multiplanar, multiecho pulse sequences of the brain and surrounding structures were obtained without intravenous contrast. Angiographic images of the head were obtained using MRA technique without contrast.  COMPARISON:  MRI 04/12/2008  FINDINGS: MRI HEAD FINDINGS  Negative for acute infarct.  Age advanced chronic microvascular ischemic changes have progressed since 2009. Multiple areas of chronic infarct are present throughout the cerebral Bannister matter involving the subcortical and deep Plaugher matter. Chronic infarcts in the thalami bilaterally. Small focal chronic lacune in the left pons. Chronic micro hemorrhage in the right thalamus. No cortical infarct.  Negative for mass lesion. Ventricle size is normal. Cerebral volume is normal.  MRA HEAD FINDINGS  Both vertebral arteries are patent to the basilar. The basilar is widely patent. Superior cerebellar and posterior cerebral arteries  are patent. Patent posterior communicating artery bilaterally with fetal origin of the left posterior cerebral artery on the left.  Internal carotid artery is patent bilaterally. Anterior and middle cerebral arteries are patent bilaterally. Atherosclerotic disease in the left A1 segment. Scattered areas of mild stenosis are present in the middle cerebral arteries bilaterally.  Negative for cerebral aneurysm.  IMPRESSION: Negative for acute infarct.  Age advanced chronic microvascular ischemic changes with progression since 2009.  Mild intracranial atherosclerotic disease.   Electronically Signed   By: Franchot Gallo M.D.   On: 02/07/2014 11:16     EKG Interpretation   Date/Time:  Tuesday February 06 2014 17:01:38 EDT Ventricular Rate:  60 PR Interval:  161 QRS Duration: 88 QT Interval:  413 QTC Calculation: 413 R Axis:   37 Text Interpretation:  Sinus rhythm Left ventricular hypertrophy  Nonspecific T abnormalities, diffuse leads No significant change since  last tracing Confirmed by POLLINA  MD, CHRISTOPHER [phone]) on 02/06/2014  8:09:02 PM      MDM   Final diagnoses:  Numbness  Transient cerebral ischemia, unspecified transient cerebral ischemia type    Pt with prior history of cva with transient episodes of numbess,  Currently resolved.  Concerning for tia episodes.  Discussed with Dr Betsey Holiday.  MRI ordered.  Discussed with Dr Leonel Ramsay who will see pt in ed as consult.  Agrees with probable tia episodes today.  Pt will need admission for risk stratification.  Requests internal medicine to admit pt.    Evalee Jefferson, PA-C 02/07/14 1120

## 2014-02-06 NOTE — ED Notes (Signed)
Attempted report x2. No answer.

## 2014-02-06 NOTE — Progress Notes (Signed)
Pt brought to MRI for scan, as pt was being put in the scanner, pt experienced a severe panic attack and refused to continue.  Pt sent back to the ER

## 2014-02-06 NOTE — ED Notes (Signed)
Admitting MD at bedside.  Attempted report x 1

## 2014-02-06 NOTE — Progress Notes (Signed)
On call MD made aware of BP of 177/122. No new orders at this time. Will cont to monitor.

## 2014-02-07 ENCOUNTER — Encounter (HOSPITAL_COMMUNITY): Payer: Self-pay | Admitting: General Practice

## 2014-02-07 ENCOUNTER — Observation Stay (HOSPITAL_COMMUNITY): Payer: Self-pay

## 2014-02-07 DIAGNOSIS — I517 Cardiomegaly: Secondary | ICD-10-CM

## 2014-02-07 DIAGNOSIS — R209 Unspecified disturbances of skin sensation: Secondary | ICD-10-CM

## 2014-02-07 LAB — HEMOGLOBIN A1C
Hgb A1c MFr Bld: 5.9 % — ABNORMAL HIGH (ref <5.7)
Mean Plasma Glucose: 123 mg/dL — ABNORMAL HIGH (ref <117)

## 2014-02-07 LAB — GLUCOSE, CAPILLARY
GLUCOSE-CAPILLARY: 122 mg/dL — AB (ref 70–99)
Glucose-Capillary: 106 mg/dL — ABNORMAL HIGH (ref 70–99)
Glucose-Capillary: 111 mg/dL — ABNORMAL HIGH (ref 70–99)

## 2014-02-07 MED ORDER — HYDROCHLOROTHIAZIDE 25 MG PO TABS
25.0000 mg | ORAL_TABLET | Freq: Every day | ORAL | Status: DC
  Administered 2014-02-07 – 2014-02-08 (×2): 25 mg via ORAL
  Filled 2014-02-07 (×2): qty 1

## 2014-02-07 MED ORDER — STUDY - INVESTIGATIONAL DRUG SIMPLE RECORD
180.0000 mg | Freq: Once | Status: AC
  Administered 2014-02-07: 180 mg via ORAL
  Filled 2014-02-07: qty 180

## 2014-02-07 MED ORDER — LISINOPRIL 10 MG PO TABS
10.0000 mg | ORAL_TABLET | Freq: Every day | ORAL | Status: DC
  Administered 2014-02-07 – 2014-02-08 (×2): 10 mg via ORAL
  Filled 2014-02-07 (×2): qty 1

## 2014-02-07 MED ORDER — STUDY - INVESTIGATIONAL DRUG SIMPLE RECORD
300.0000 mg | Freq: Once | Status: AC
  Administered 2014-02-07: 300 mg via ORAL
  Filled 2014-02-07: qty 300

## 2014-02-07 MED ORDER — STUDY - INVESTIGATIONAL DRUG SIMPLE RECORD
100.0000 mg | Freq: Every day | Status: DC
  Administered 2014-02-08: 100 mg via ORAL
  Filled 2014-02-07 (×2): qty 100

## 2014-02-07 MED ORDER — STUDY - INVESTIGATIONAL DRUG SIMPLE RECORD
90.0000 mg | Freq: Two times a day (BID) | Status: DC
  Administered 2014-02-08: 90 mg via ORAL
  Filled 2014-02-07 (×3): qty 90

## 2014-02-07 MED ORDER — ASPIRIN 325 MG PO TABS: 325.0000 mg | ORAL_TABLET | Freq: Every day | ORAL | Status: DC

## 2014-02-07 MED ORDER — ATORVASTATIN CALCIUM 40 MG PO TABS
40.0000 mg | ORAL_TABLET | Freq: Every day | ORAL | Status: DC
  Administered 2014-02-07: 40 mg via ORAL
  Filled 2014-02-07 (×2): qty 1

## 2014-02-07 NOTE — ED Provider Notes (Signed)
Medical screening examination/treatment/procedure(s) were performed by non-physician practitioner and as supervising physician I was immediately available for consultation/collaboration.   EKG Interpretation   Date/Time:  Tuesday February 06 2014 17:01:38 EDT Ventricular Rate:  60 PR Interval:  161 QRS Duration: 88 QT Interval:  413 QTC Calculation: 413 R Axis:   37 Text Interpretation:  Sinus rhythm Left ventricular hypertrophy  Nonspecific T abnormalities, diffuse leads No significant change since  last tracing Confirmed by Betsey Holiday  MD, Jashae Wiggs [phone]) on 02/06/2014  8:09:02 PM        Orpah Greek, MD 02/07/14 [phone]

## 2014-02-07 NOTE — Progress Notes (Signed)
VASCULAR LAB PRELIMINARY  PRELIMINARY  PRELIMINARY  PRELIMINARY  Carotid duplex completed.    Preliminary report:  Bilateral:  1-39% ICA stenosis.  Vertebral artery flow is antegrade.     Glenn Newton, RVS 02/07/2014, 11:41 AM

## 2014-02-07 NOTE — Progress Notes (Signed)
Subjective: Patient reports that he had another brief episode of right arm numbness a few minutes ago while washing up at the sink. He states the numbness was in his right forearm and did not radiate to his hand or above the elbow. The numbness lasted about 5 seconds. He denies numbness in his right face and right leg as he experienced yesterday. He states the numbness was associated with weakness, where he felt he was unable to make a fist with his right hand. This lasted only 5 seconds as well. He denies any changes in vision, weakness or numbness in any other extremities.   Patient to have full TIA work-up with MRI brain, MRA brain, carotid doppler, and echo today.  Patient also has several lesions on his lower extremities bilaterally that he states he has had for over a year. He states they have stayed about the same size. He has not had any treatment for the lesions. Patient was prescribed Clotrimazole topical by the night team, but he has not used the medication yet.  Objective: Vital signs in last 24 hours: Filed Vitals:   02/06/14 2301 02/07/14 0007 02/07/14 0400 02/07/14 0732  BP: 177/122 184/116 152/100 149/95  Pulse: 56  59 60  Temp: 98 F (36.7 C)  98.2 F (36.8 C) 98.1 F (36.7 C)  TempSrc: Oral  Oral Oral  Resp: 18  18 17   Height: 5\' 5"  (1.651 m)     Weight: 172 lb 9.6 oz (78.291 kg)     SpO2: 100%  100% 100%   Weight change:   Intake/Output Summary (Last 24 hours) at 02/07/14 1038 Last data filed at 02/07/14 0752  Gross per 24 hour  Intake      0 ml  Output    700 ml  Net   -700 ml   Physical Exam General: alert, cooperative, sitting up in bed, anxious HEENT: [place]/AT, EOMI, PERRLA, conjunctiva normal, mucus membranes moist Neck: Supple, no JVD CV: RRR, normal S1/S2, no m/g/r Pulm: CTA bilaterally Abd: BS+, soft, non-distended, non-tender Ext: moves all, warm, no edema. Patient has multiple lesions on bilateral lower extremities. Lesions look chronic and scabbed  over. 2 larger lesions on medial side of left lower extremity and lateral side of right lower extremity. Lesions are 2 cm in diameter with S[place], raised scaling skin change. Lesions extend to lower thigh.  Neuro: alert and oriented x 3, strength 5/5 in upper and lower extremities, CNs II-XII intact  Lab Results: Basic Metabolic Panel:  Recent Labs Lab 02/06/14 1825  NA 138  K 4.1  CL 103  GLUCOSE 98  BUN 12  CREATININE 1.30   Liver Function Tests: No results found for this basename: AST, ALT, ALKPHOS, BILITOT, PROT, ALBUMIN,  in the last 168 hours No results found for this basename: LIPASE, AMYLASE,  in the last 168 hours No results found for this basename: AMMONIA,  in the last 168 hours CBC:  Recent Labs Lab 02/06/14 1809 02/06/14 1825  WBC 4.3  --   NEUTROABS 2.2  --   HGB 12.7* 14.3  HCT 38.6* 42.0  MCV 86.9  --   PLT 234  --    Cardiac Enzymes: No results found for this basename: CKTOTAL, CKMB, CKMBINDEX, TROPONINI,  in the last 168 hours BNP: No results found for this basename: PROBNP,  in the last 168 hours D-Dimer: No results found for this basename: DDIMER,  in the last 168 hours CBG:  Recent Labs Lab 02/07/14 0716  GLUCAP 122*   Hemoglobin A1C: No results found for this basename: HGBA1C,  in the last 168 hours Fasting Lipid Panel: No results found for this basename: CHOL, HDL, LDLCALC, TRIG, CHOLHDL, LDLDIRECT,  in the last 168 hours Thyroid Function Tests: No results found for this basename: TSH, T4TOTAL, FREET4, T3FREE, THYROIDAB,  in the last 168 hours Coagulation: No results found for this basename: LABPROT, INR,  in the last 168 hours Anemia Panel: No results found for this basename: VITAMINB12, FOLATE, FERRITIN, TIBC, IRON, RETICCTPCT,  in the last 168 hours Urine Drug Screen: Drugs of Abuse     Component Value Date/Time   LABOPIA NONE DETECTED 02/06/2014 1944   COCAINSCRNUR NONE DETECTED 02/06/2014 1944   LABBENZ NONE DETECTED 02/06/2014  1944   AMPHETMU NONE DETECTED 02/06/2014 1944   THCU NONE DETECTED 02/06/2014 1944   LABBARB NONE DETECTED 02/06/2014 1944    Alcohol Level: No results found for this basename: ETH,  in the last 168 hours Urinalysis:  Recent Labs Lab 02/06/14 1944  COLORURINE YELLOW  LABSPEC 1.022  PHURINE 5.5  GLUCOSEU NEGATIVE  HGBUR NEGATIVE  BILIRUBINUR NEGATIVE  KETONESUR NEGATIVE  PROTEINUR NEGATIVE  UROBILINOGEN 1.0  NITRITE NEGATIVE  LEUKOCYTESUR TRACE*     Micro Results: No results found for this or any previous visit (from the past 240 hour(s)). Studies/Results: No results found. Medications: I have reviewed the patient's current medications. Scheduled Meds: . aspirin EC  81 mg Oral Daily  . clotrimazole   Topical BID  . enoxaparin (LOVENOX) injection  40 mg Subcutaneous Daily  . simvastatin  10 mg Oral q1800   Continuous Infusions:  PRN Meds:.acetaminophen, hydrALAZINE Assessment/Plan: Mr. Windt is a 43yo man w/ PMHx of CVA in 2009, HTN, and HPL who presented to the ED with right-sided numbness and dizziness.  1. Suspected TIA: Pt presented with headache, numbness and tingling in right extremities, and the right side of his mouth associated with diaphoresis, dizziness and lightheadedness. All symptoms resolved within the span of several seconds with some residual numbness in right foot. Pt has a history of CVA in September of 2009. MRI/MRA showed two foci of acute infarction, the larger in the left thalamus/posterior limb internal capsule and the smaller in the right parietal subcortical Kregel matter. Diffuse intracranial medium sized vessel atherosclerosis with focal stenosis of the left A1 segment. Considerable atherosclerotic irregularity of the basilar artery but without a dominant focal stenosis. Pt had residual numbness in 2nd finger of right hand after his CVA. Pt did not receive CT head w/o contrast in the ED. Dr. Leonel Ramsay, neurology, agreed with this decision since he was  back at baseline. This AM, patient reported he had another episode of numbness in his right forearm while washing up at the sink. He states the numbness lasted for only 5 seconds with no residual numbness present currently. He states the numbness was only in his forearm this time, with no radiation into the hand or above the elbow. He had associated weakness in his right hand, where he felt he could not make a fist. This lasted for only 5 seconds. He denies numbness in his face or lower extremities. He denies changes in vision. Patient at risk for TIA with hx of previous TIA, elevated lipids (last lipid panel on 12/14/13 shows Chol 222, Trigly 180, HDL 70, LDL 116). Pt on Lovastatin 20 mg at home. Per neuro, patient apparently had hypercoagulable work-up during admission for previous stroke but unable to find results. May consider repeating  studies.   - f/u MRI/MRA (with Ativan 1 mg for anxiety) - f/u carotid doppler - f/u Echocardiogram  - Simvastatin 10 mg - ASA 81 mg daily  - HbA1c pending - PT/OT consulted - Neuro checks with vital signs  - Consider hypercoagulable work-up  2. Tinea Corporis: Pt complains of bilateral lesions on lower extremities. He states he has had the lesions for over a year. He has not sought out any treatment for them. Multiple scab like lesions on proximal and distal anterior and medial areas, but also 2 larger lesions on the medial side of left lower extremity and lateral side of right lower extremity. Lesions are 2 cm in diameter with [place], raised scaling skin change. Lesions extend up to lower thighs bilaterally. Pt states these are itchy only after he takes a hot shower. He was previously diagnosed with scabies in June 2015 and given permethrin cream. He states this helped, but he ran out.  - Continue Clotrimazole 1% BID   3. HTN: BP 163/104 on admission. Now stable in 150s/90-100s. Patient takes HCTZ 25 mg daily and Lisinopril 10 mg daily at home. - Will hold BP meds  until MRI results back - Hydralazine 5 mg IV Q8H prn SBP>180 or DBP >110   4. HLD: Most recent lipid panel 12/14/13 shows cholesterol 222, TG 180, HDL 70, LDL 116. On Lovastatin 20 mg at home.  - Simvastatin 10 mg daily   Diet: NPO DVT/PE PPx: Lovenox 40 mg SQ daily Dispo: Disposition is deferred at this time, awaiting improvement of current medical problems.  Anticipated discharge in approximately 1-2 day(s).   The patient does not have a current PCP (No Pcp Per Patient) and does need an [facility] hospital follow-up appointment after discharge.  The patient does not have transportation limitations that hinder transportation to clinic appointments.  .Services Needed at time of discharge: Y = Yes, Blank = No PT:   OT:   RN:   Equipment:   Other:     LOS: 1 day   Albin Felling, MD 02/07/2014, 10:38 AM

## 2014-02-07 NOTE — Progress Notes (Signed)
OT Cancellation Note  Patient Details Name: Glenn Newton MRN: [id number] DOB: 05-03-1971   Cancelled Treatment:    Reason Eval/Treat Not Completed: OT screened, no needs identified, will sign off. Educated pt briefly on safety with ADLs, fall prevention, and signs and symptoms of stroke using FAST.   Juluis Rainier  [phone]  02/07/2014, 3:04 PM

## 2014-02-07 NOTE — Progress Notes (Signed)
Stroke Team Progress Note  HISTORY Glenn Newton is a 43 y.o. male with a history of previous stroke in 2009 who presents with transient right-sided numbness and weakness it happened twice earlier today. He states that both episodes happened within a couple of hours of each other, first just prior 4 PM 02/06/2014. He states that with the first episode was just his right arm, however with the second episode was in his right arm leg and face as well as a feeling of being "faint." Was the second episode prompted him to seek further care in the emergency department.The episodes lasted few minutes each. He currently is completely back to his baseline. The only residual deficit he has from his previous stroke is right index finger numbness. Patient was not administered TPA secondary to  resolved symptoms. He was admitted for further evaluation and treatment. He has past h/o left frontal infarct in 2009 with similar right-sided symptoms but he made full neurological 3 from that. SUBJECTIVE  No family is at the bedside.  Overall he feels his condition is completely resolved.   OBJECTIVE Most recent Vital Signs: Filed Vitals:   02/07/14 0007 02/07/14 0400 02/07/14 0732 02/07/14 1202  BP: 184/116 152/100 149/95 181/117  Pulse:  59 60 63  Temp:  98.2 F (36.8 C) 98.1 F (36.7 C) 98.1 F (36.7 C)  TempSrc:  Oral Oral Oral  Resp:  18 17 19   Height:      Weight:      SpO2:  100% 100% 100%   CBG (last 3)   Recent Labs  02/07/14 0716 02/07/14 1201  GLUCAP 122* 106*    IV Fluid Intake:     MEDICATIONS  . aspirin EC  81 mg Oral Daily  . clotrimazole   Topical BID  . enoxaparin (LOVENOX) injection  40 mg Subcutaneous Daily  . simvastatin  10 mg Oral q1800   PRN:  acetaminophen, hydrALAZINE  Diet:  NPO  Activity:  As tolerated DVT Prophylaxis:  Lovenox 40 mg sq daily   CLINICALLY SIGNIFICANT STUDIES Basic Metabolic Panel:  Recent Labs Lab 02/06/14 1825  NA 138  K 4.1  CL 103  GLUCOSE  98  BUN 12  CREATININE 1.30   Liver Function Tests: No results found for this basename: AST, ALT, ALKPHOS, BILITOT, PROT, ALBUMIN,  in the last 168 hours CBC:  Recent Labs Lab 02/06/14 1809 02/06/14 1825  WBC 4.3  --   NEUTROABS 2.2  --   HGB 12.7* 14.3  HCT 38.6* 42.0  MCV 86.9  --   PLT 234  --    Coagulation: No results found for this basename: LABPROT, INR,  in the last 168 hours Cardiac Enzymes: No results found for this basename: CKTOTAL, CKMB, CKMBINDEX, TROPONINI,  in the last 168 hours Urinalysis:  Recent Labs Lab 02/06/14 1944  COLORURINE YELLOW  LABSPEC 1.022  PHURINE 5.5  GLUCOSEU NEGATIVE  HGBUR NEGATIVE  BILIRUBINUR NEGATIVE  KETONESUR NEGATIVE  PROTEINUR NEGATIVE  UROBILINOGEN 1.0  NITRITE NEGATIVE  LEUKOCYTESUR TRACE*   Lipid Panel    Component Value Date/Time   CHOL 222* 12/14/2013 0445   TRIG 180* 12/14/2013 0445   HDL 70 12/14/2013 0445   CHOLHDL 3.2 12/14/2013 0445   VLDL 36 12/14/2013 0445   LDLCALC 116* 12/14/2013 0445   HgbA1C  Lab Results  Component Value Date   HGBA1C 6.0 11/09/2008    Urine Drug Screen:     Component Value Date/Time   LABOPIA NONE DETECTED 02/06/2014  B and E 02/06/2014 [place] DETECTED 02/06/2014 1944   AMPHETMU NONE DETECTED 02/06/2014 1944   THCU NONE DETECTED 02/06/2014 1944   LABBARB NONE DETECTED 02/06/2014 1944    Alcohol Level: No results found for this basename: ETH,  in the last 168 hours   MRI of the brain  02/07/2014   Negative for acute infarct.   MRA of the brain  02/07/2014    Age advanced chronic microvascular ischemic changes with progression since 2009.  Mild intracranial atherosclerotic disease.  Carotid Doppler  No evidence of hemodynamically significant internal carotid artery stenosis. Vertebral artery flow is antegrade.   2D Echocardiogram    CXR  12/13/2013 No active cardiopulmonary disease.  EKG  Sinus bradycardia. T wave abnormality, consider lateral ischemia.  For complete results please see formal report.   Therapy Recommendations    Physical Exam  Pleasant middle aged african [place] male not in distress.Awake alert. Afebrile. Head is nontraumatic. Neck is supple without bruit. Hearing is normal. Cardiac exam no murmur or gallop. Lungs are clear to auscultation. Distal pulses are well felt. Neurological Exam ;  Awake  Alert oriented x 3. Normal speech and language.eye movements full without nystagmus.fundi were not visualized. Vision acuity and fields appear normal. Hearing is normal. Palatal movements are normal. Face symmetric. Tongue midline. Normal strength, tone, reflexes and coordination. Normal sensation. Gait deferred.  ABCD2 square score 4   ASSESSMENT Mr. Glenn Newton is a 43 y.o. male presenting with Transient right-sided numbness and weakness. No tPA secondary to resolultion of symptoms. Imaging confirms no acute infarct. Dx:  Left brain TIA.  On aspirin 81 mg orally every day prior to admission. Now on aspirin 81 mg orally every day for secondary stroke prevention. Patient with no resultant neuro deficits. Stroke work up underway.  Hypertension. On HCTZ and lisinopril at home, not continued in hospital. Permissive hypertension allowed with BP 142-184/95-117 the past 24h. Hyperlipidemia, LDL 116, on mevacor 20 mg PTA, formulary change to zocor 10 in hospital,  goal LDL < 100.  Cigarette smoker, stated he stopped 24 days ago, he was advised to continue stopping smoking   Hospital day # 1  TREATMENT/PLAN  Continue aspirin 81 mg orally every day for secondary stroke prevention. Patient interested in considering SOCRATES research study for secondary stroke prevention. Dr. Leonie Man spoke with patient. [facility] will follow up. Socrates is a comparison of 90-day treatment with ticagrelor vs aspirin for the prevention of major vascular events in patients with acute ischaemic stroke or TIA.  F/u 2D echo Therapy  evals Increase/adjust home dose of statin for better control Ok to resume home BP meds at this time. No indication for ongoing permissive hypertension in the setting of TIA  Burnetta Sabin, MSN, RN, ANVP-BC, ANP-BC, GNP-BC [facility] Pager: [phone] 02/07/2014 1:19 PM I have personally examined this patient, reviewed notes, independently viewed imaging studies, participated in medical decision making and plan of care. I have made any additions or clarifications directly to the above note. Agree with note above. The patient has a left hemispheric TIA and has high risk with ABCD 2 square score of 4. He has 10-15% risk of having a stroke within the next one year with most of the risk being upfront over the next few days. Patient will consider possible participation in the Socrates trial of aspirin versus ticagrelor for secondary stroke prevention  Antony Contras, MD Medical Director [facility]  Pager: [phone] 02/07/2014 4:41 PM  SIGNED    To contact Stroke Continuity provider, please refer to [email or web address] After hours, contact General Neurology

## 2014-02-07 NOTE — Progress Notes (Signed)
Echocardiogram 2D Echocardiogram has been performed.  02/07/2014 11:28 AM Maudry Mayhew, RVT, RDCS, RDMS

## 2014-02-07 NOTE — Progress Notes (Signed)
Utilization Review Completed.Glenn Newton T7/29/2015  

## 2014-02-07 NOTE — Evaluation (Signed)
Physical Therapy Evaluation and Discharge Patient Details Name: Glenn Newton MRN: [id number] DOB: April 15, 1971 Today's Date: 02/07/2014   History of Present Illness  Glenn Newton is a 43 y.o. male with a history of previous stroke in 2009 who presents with 2 recent episodes of transient right-sided numbness.  Clinical Impression  Pt admitted with above.  Pt is currently functioning at baseline level in terms of gait and mobility.  Did discuss s/s of CVA and risk factors.  Pt verbalized that he had quit smoking recently and that has helped him feel better.  No further skilled PT needs identified and will d/c.    Follow Up Recommendations No PT follow up    Equipment Recommendations  None recommended by PT    Recommendations for Other Services       Precautions / Restrictions Precautions Precautions: None      Mobility  Bed Mobility Overal bed mobility: Independent                Transfers Overall transfer level: Independent                  Ambulation/Gait Ambulation/Gait assistance: Independent Ambulation Distance (Feet): 250 Feet Assistive device: None Gait Pattern/deviations: WFL(Within Functional Limits)   Gait velocity interpretation: at or above normal speed for age/gender General Gait Details: Pt able to perform start/stops, head turns/nods, turns with no difficulty and no LOB.  Stairs            Wheelchair Mobility    Modified Rankin (Stroke Patients Only)       Balance Overall balance assessment: Independent                                           Pertinent Vitals/Pain Denies pain.    Home Living Family/patient expects to be discharged to:: Private residence Living Arrangements: Alone   Type of Home: House Home Access: Stairs to enter Entrance Stairs-Rails: Can reach both Entrance Stairs-Number of Steps: 2-3 Home Layout: One level Home Equipment: None      Prior Function Level of Independence:  Independent         Comments: [employer] -stands for 8 hour shifts     Hand Dominance   Dominant Hand: Right    Extremity/Trunk Assessment   Upper Extremity Assessment: Defer to OT evaluation           Lower Extremity Assessment: Overall WFL for tasks assessed      Cervical / Trunk Assessment: Normal  Communication   Communication: No difficulties  Cognition Arousal/Alertness: Awake/alert Behavior During Therapy: WFL for tasks assessed/performed Overall Cognitive Status: Within Functional Limits for tasks assessed                      General Comments General comments (skin integrity, edema, etc.): Pt with no LOB during gait with starts/stops, head nods/turns.  Put shoes on standing up.  Educated on safety to sit to donn to decrease fall risk.    Exercises        Assessment/Plan    PT Assessment Patent does not need any further PT services  PT Diagnosis     PT Problem List    PT Treatment Interventions     PT Goals (Current goals can be found in the Care Plan section) Acute Rehab PT Goals PT Goal Formulation: No goals set, d/c therapy  Frequency     Barriers to discharge        Co-evaluation               End of Session   Activity Tolerance: Patient tolerated treatment well Patient left: in chair Nurse Communication: Other (comment) (okayed session)    Functional Assessment Tool Used: clinical judgement and objective findings Functional Limitation: Mobility: Walking and moving around Mobility: Walking and Moving Around Current Status [phone]): 0 percent impaired, limited or restricted Mobility: Walking and Moving Around Goal Status [phone]): 0 percent impaired, limited or restricted Mobility: Walking and Moving Around Discharge Status [phone]): 0 percent impaired, limited or restricted    Time: 8206-0156 PT Time Calculation (min): 20 min   Charges:   PT Evaluation $Initial PT Evaluation Tier I: 1 Procedure PT Treatments $Gait  Training: 8-22 mins   PT G Codes:   Functional Assessment Tool Used: clinical judgement and objective findings Functional Limitation: Mobility: Walking and moving around    [place] 02/07/2014, 1:06 PM

## 2014-02-07 NOTE — Progress Notes (Signed)
OT Cancellation Note  Patient Details Name: Glenn Newton MRN: [id number] DOB: 15-Apr-1971   Cancelled Treatment:    Reason Eval/Treat Not Completed: Patient at procedure or test/ unavailable. OT to follow up as available.   Juluis Rainier [phone] 02/07/2014, 11:06 AM

## 2014-02-07 NOTE — Consult Note (Addendum)
Neurology Consultation Reason for Consult: Transient right-sided numbness Referring Physician: Betsey Holiday, C  CC: Transient right-sided numbness  History is obtained from: Patient  HPI: Glenn Newton is a 43 y.o. male with a history of previous stroke in 2009 who presents with transient right-sided numbness it happened twice earlier today. He states that both episodes happened within a couple of hours of each other, first just prior 4 PM. He states that with the first episode was just his right arm, however with the second episode was in his right arm leg and face as well as a feeling of being "faint." Was the second episode prompted him to seek further care in the emergency department. He currently is completely back to his baseline.  The only residual deficit he has from his previous stroke is right index finger numbness.   LKW: 4 PM tpa given?: no, resolved symptoms    ROS: A 14 point ROS was performed and is negative except as noted in the HPI.   Past Medical History  Diagnosis Date  . Stroke 04/2008    left thalamus/posterior limb internal capsule and right parietal subcortical Ebbert matter.  . Hypertension   . Left ventricular hypertrophy   . Hypertension     Family History: Father-stroke  Social History: Tob: Quit earlier this month  Exam: Current vital signs: BP 184/116  Pulse 56  Temp(Src) 98 F (36.7 C) (Oral)  Resp 18  Ht 5\' 5"  (1.651 m)  Wt 78.291 kg (172 lb 9.6 oz)  BMI 28.72 kg/m2  SpO2 100% Vital signs in last 24 hours: Temp:  [98 F (36.7 C)-98.6 F (37 C)] 98 F (36.7 C) (07/28 2301) Pulse Rate:  [56-61] 56 (07/28 2301) Resp:  [15-18] 18 (07/28 2301) BP: (142-184)/(98-122) 184/116 mmHg (07/29 0007) SpO2:  [100 %] 100 % (07/28 2301) Weight:  [78.291 kg (172 lb 9.6 oz)-79.833 kg (176 lb)] 78.291 kg (172 lb 9.6 oz) (07/28 2301)  General: In bed, NAD CV: Regular rate and rhythm Mental Status: Patient is awake, alert, oriented to person, place,  month, year, and situation. Immediate and remote memory are intact. Patient is able to give a clear and coherent history. No signs of aphasia or neglect Cranial Nerves: II: Visual Fields are full. Pupils are round, and reactive to light, right slightly larger than left.  Discs are difficult to visualize. III,IV, VI: EOMI without ptosis or diploplia.  V: Facial sensation is symmetric to temperature VII: Facial movement is symmetric.  VIII: hearing is intact to voice X: Uvula elevates symmetrically XI: Shoulder shrug is symmetric. XII: tongue is midline without atrophy or fasciculations.  Motor: Tone is normal. Bulk is normal. 5/5 strength was present in all four extremities.  Sensory: Sensation is symmetric to light touch and temperature in the arms and legs with the exception of his right index finger which has decreased sensation Deep Tendon Reflexes: 2+ and symmetric in the biceps and patellae.  Cerebellar: FNF and HKS are intact bilaterally Gait: Patient has a stable casual gait.    I have reviewed labs in epic and the results pertinent to this consultation are: chem 8-unremarkable  Impression: 43 year old male with a history of previous stroke who presents with right-sided numbness which has resolved. He is also hypertensive. He is on aspirin at baseline. The episode today is very concerning for TIA in a gentleman with a history of stroke.  He had a hypercoagulable panel sent with his previous stroke(at least it was mentioned in a hospital followup  clinic note that this had been done), though not sure what exactly were the labs that were sent as I do not have these records.  Recommendations: 1. HgbA1c, fasting lipid panel 2. MRI, MRA  of the brain without contrast 3. Frequent neuro checks 4. Echocardiogram 5. Carotid dopplers 6. Prophylactic therapy-Antiplatelet med: Aspirin - dose 325mg  PO or 300mg  PR 7. Risk factor modification 8. Telemetry monitoring 9. obtain records  of previous hypercoag panel   Roland Rack, MD [facility] [phone]  If 7pm- 7am, please page neurology on call as listed in [place].

## 2014-02-07 NOTE — H&P (Signed)
INTERNAL MEDICINE TEACHING ATTENDING NOTE  Day 1 of stay  Patient name: Glenn Newton  MRN: [id number] Date of birth: November 10, 1970   Key clinical points and exam                                                           43 y.o. man with past medical history of CVA (2009) and TIA, admitted for the complaint of numbness (and questionable stiffness/weakness) of the right side of his body at about 4pm yesterday. His symptoms lasted for 5-10 minutes during which time he felt presyncopal. He denies any such symptom at this time, however he reports feeling the same numbness this morning when he walked towards the bathroom for a few seconds transiently. He has been admitted for TIA work up. He has been seen by neurology and is on the way to get an MRI. His exam - stable vitals, alert and oriented, no focal neurological deficits (intact motor and sensory functions, intact cranial nerves, intact cerebellar testing)   I have reviewed the chart, lab results, EKG, imaging and relevant notes of this patient.   Assessment and Plan                                                                       TIA with ABCD2 score of 4 (high BP, questionable weakness and 10 minutes of symptoms). He is being worked up for risk factors of stroke. At home he is currently on aspirin 81, HCTZ 25, lisinopril 10, lovastatin 20. I discussed with him the advantages of being on a higher dose statin. He would like to know how much it would cost him to be on Atorvastatin 40. We will also increase aspirin to 325. We appreciate neurology input.   I have seen and evaluated this patient and discussed it with my IM resident team- Dr Arcelia Jew and Ethelene Hal.  Please see the rest of the plan per resident note from today by Dr Arcelia Jew.   Bradford Woods, [place] 02/07/2014, 12:54 PM.

## 2014-02-08 LAB — GLUCOSE, CAPILLARY
GLUCOSE-CAPILLARY: 111 mg/dL — AB (ref 70–99)
Glucose-Capillary: 119 mg/dL — ABNORMAL HIGH (ref 70–99)

## 2014-02-08 MED ORDER — ATORVASTATIN CALCIUM 40 MG PO TABS: 40.0000 mg | ORAL_TABLET | Freq: Every day | ORAL | Status: DC

## 2014-02-08 MED ORDER — CLOTRIMAZOLE 1 % EX CREA: TOPICAL_CREAM | Freq: Two times a day (BID) | CUTANEOUS | Status: DC

## 2014-02-08 NOTE — Progress Notes (Signed)
Stroke Team Progress Note  HISTORY Glenn Newton is a 43 y.o. male with a history of previous stroke in 2009 who presents with transient right-sided numbness and weakness it happened twice earlier today. He states that both episodes happened within a couple of hours of each other, first just prior 4 PM 02/06/2014. He states that with the first episode was just his right arm, however with the second episode was in his right arm leg and face as well as a feeling of being "faint." Was the second episode prompted him to seek further care in the emergency department.The episodes lasted few minutes each. He currently is completely back to his baseline. The only residual deficit he has from his previous stroke is right index finger numbness. He has past h/o left frontal infarct in 2009 with similar right-sided symptoms but he made full neurological recovery from that.  Patient was not administered TPA secondary to  resolved symptoms. He was admitted for further evaluation and treatment.  SUBJECTIVE Patient up at bedside, attempting to get his gown own.  OBJECTIVE Most recent Vital Signs: Filed Vitals:   02/07/14 2000 02/07/14 2349 02/08/14 0400 02/08/14 0743  BP: 144/88 147/104 148/96 145/104  Pulse: 72 65 63 60  Temp: 98.4 F (36.9 C) 98.5 F (36.9 C) 97.7 F (36.5 C) 97.8 F (36.6 C)  TempSrc: Oral Oral Oral Oral  Resp: 18 18 18 17   Height:      Weight:   78.198 kg (172 lb 6.3 oz)   SpO2: 100% 100% 100% 100%   CBG (last 3)   Recent Labs  02/07/14 1631 02/07/14 2001 02/08/14 0722  GLUCAP 111* 119* 111*    IV Fluid Intake:     MEDICATIONS  . atorvastatin  40 mg Oral q1800  . clotrimazole   Topical BID  . hydrochlorothiazide  25 mg Oral Daily  . lisinopril  10 mg Oral Daily  . research study medication  100 mg Oral Daily  . research study medication  90 mg Oral BID   PRN:  acetaminophen, hydrALAZINE  Diet:  Cardiac thin liquids Activity:  As tolerated DVT Prophylaxis:     CLINICALLY SIGNIFICANT STUDIES Basic Metabolic Panel:   Recent Labs Lab 02/06/14 1825  NA 138  K 4.1  CL 103  GLUCOSE 98  BUN 12  CREATININE 1.30   Liver Function Tests: No results found for this basename: AST, ALT, ALKPHOS, BILITOT, PROT, ALBUMIN,  in the last 168 hours CBC:   Recent Labs Lab 02/06/14 1809 02/06/14 1825  WBC 4.3  --   NEUTROABS 2.2  --   HGB 12.7* 14.3  HCT 38.6* 42.0  MCV 86.9  --   PLT 234  --    Coagulation: No results found for this basename: LABPROT, INR,  in the last 168 hours Cardiac Enzymes: No results found for this basename: CKTOTAL, CKMB, CKMBINDEX, TROPONINI,  in the last 168 hours Urinalysis:   Recent Labs Lab 02/06/14 1944  COLORURINE YELLOW  LABSPEC 1.022  PHURINE 5.5  GLUCOSEU NEGATIVE  HGBUR NEGATIVE  BILIRUBINUR NEGATIVE  KETONESUR NEGATIVE  PROTEINUR NEGATIVE  UROBILINOGEN 1.0  NITRITE NEGATIVE  LEUKOCYTESUR TRACE*   Lipid Panel    Component Value Date/Time   CHOL 222* 12/14/2013 0445   TRIG 180* 12/14/2013 0445   HDL 70 12/14/2013 0445   CHOLHDL 3.2 12/14/2013 0445   VLDL 36 12/14/2013 0445   LDLCALC 116* 12/14/2013 0445   HgbA1C  Lab Results  Component Value Date  HGBA1C 5.9* 02/06/2014    Urine Drug Screen:     Component Value Date/Time   LABOPIA NONE DETECTED 02/06/2014 1944   COCAINSCRNUR NONE DETECTED 02/06/2014 1944   LABBENZ NONE DETECTED 02/06/2014 1944   AMPHETMU NONE DETECTED 02/06/2014 1944   THCU NONE DETECTED 02/06/2014 1944   LABBARB NONE DETECTED 02/06/2014 1944    Alcohol Level: No results found for this basename: ETH,  in the last 168 hours   MRI of the brain  02/07/2014   Negative for acute infarct.   MRA of the brain  02/07/2014    Age advanced chronic microvascular ischemic changes with progression since 2009.  Mild intracranial atherosclerotic disease.  Carotid Doppler  No evidence of hemodynamically significant internal carotid artery stenosis. Vertebral artery flow is antegrade.   2D  Echocardiogram  Results pending, test completed  CXR  12/13/2013 No active cardiopulmonary disease.  EKG  Sinus bradycardia. T wave abnormality, consider lateral ischemia. For complete results please see formal report.   Therapy Recommendations no therapy needs  Physical Exam  Pleasant middle aged african [place] male not in distress.Awake alert. Afebrile. Head is nontraumatic. Neck is supple without bruit. Hearing is normal. Cardiac exam no murmur or gallop. Lungs are clear to auscultation. Distal pulses are well felt. Neurological Exam ;  Awake  Alert oriented x 3. Normal speech and language.eye movements full without nystagmus.fundi were not visualized. Vision acuity and fields appear normal. Hearing is normal. Palatal movements are normal. Face symmetric. Tongue midline. Normal strength, tone, reflexes and coordination. Normal sensation. Gait deferred.  ABCD2 square score 4   ASSESSMENT Mr. Glenn Newton is a 43 y.o. male presenting with Transient right-sided numbness and weakness. No tPA secondary to resolultion of symptoms. Imaging confirms no acute infarct. Dx:  Left brain TIA.  On aspirin 81 mg orally every day prior to admission. Now enrolled in [facility] for secondary stroke prevention. Socrates is a comparison of 90-day treatment with ticagrelor vs aspirin for the prevention of major vascular events in patients with acute ischaemic stroke or TIA. Patient with no resultant neuro deficits. Stroke work up completed.  Hypertension. home HCTZ and lisinopril resumed, BP 142/87-117 the past 24h. Hyperlipidemia, LDL 116, on mevacor 20 mg PTA, changed to lipitor 40 in hospital,  goal LDL < 100.  Cigarette smoker, stated he stopped 24 days ago, he was advised to continue stopping smoking   Hospital day # 2  TREATMENT/PLAN  Continue SOCRATES for secondary stroke prevention. F/u 2D echo results No therapy needs Ok for discharge from stroke standpoint Patient has a 10-15% risk of having  another stroke over the next year, the highest risk is within 2 weeks of the most recent stroke/TIA (risk of having a stroke following a stroke or TIA is the same). Ongoing risk factor control by Primary Care Physician Followup in [place] research research in 1 week for Socrates trial study visit Follow up with Dr. Erlinda Hong, [facility], in 2 months.  Burnetta Sabin, MSN, RN, ANVP-BC, ANP-BC, [facility] Pager: [phone] 02/08/2014 8:24 AM  I have personally examined this patient, reviewed notes, independently viewed imaging studies, participated in medical decision making and plan of care. I have made any additions or clarifications directly to the above note. Agree with note above.    Antony Contras, MD   Pager: [phone] 02/08/2014 12:00 PM       To contact Stroke Continuity provider, please refer to [email or web address] After hours, contact General Neurology

## 2014-02-08 NOTE — Progress Notes (Signed)
1028 02-08-14 Care Manager provided pt with information for PCP. Pt states he is using the [facility] for now. No furhter needs from CM at this time. Bethena Roys, RN,BSN [phone]

## 2014-02-08 NOTE — Progress Notes (Addendum)
Subjective: Patient reports he is feeling better this AM. No overnight events. Patient denies any more episodes of numbness in his right arm, leg, and face. He denies tingling, weakness, changes in vision, CP, SOB. Carotid doppler yesterday showed minimal stenosis (1-39%) in the ICAs bilaterally.   Patient reports Lotrimin cream seems to be improving his lower extremity skin lesions.   Objective: Vital signs in last 24 hours: Filed Vitals:   02/07/14 2000 02/07/14 2349 02/08/14 0400 02/08/14 0743  BP: 144/88 147/104 148/96 145/104  Pulse: 72 65 63 60  Temp: 98.4 F (36.9 C) 98.5 F (36.9 C) 97.7 F (36.5 C) 97.8 F (36.6 C)  TempSrc: Oral Oral Oral Oral  Resp: 18 18 18 17   Height:      Weight:   172 lb 6.3 oz (78.198 kg)   SpO2: 100% 100% 100% 100%   Weight change: -3 lb 9.7 oz (-1.635 kg)  Intake/Output Summary (Last 24 hours) at 02/08/14 0750 Last data filed at 02/08/14 0415  Gross per 24 hour  Intake      0 ml  Output   2650 ml  Net  -2650 ml   Physical Exam General: alert, cooperative, sitting up in bed HEENT: [place]/AT, EOMI, PERRLA, conjunctiva normal, mucus membranes moist  Neck: Supple, no JVD  CV: RRR, normal S1/S2, no m/g/r  Pulm: CTA bilaterally  Abd: BS+, soft, non-distended, non-tender  Ext: moves all, warm, no edema. Patient has multiple lesions on bilateral lower extremities. Lesions look chronic and scabbed over. 2 larger lesions on medial side of left lower extremity and lateral side of right lower extremity. Lesions are 2 cm in diameter with [place], raised scaling skin change. Lesions extend to lower thigh. Lesions appear to be improving with Lotrimin cream.  Neuro: alert and oriented x 3, strength 5/5 in upper and lower extremities, CNs II-XII intact  Lab Results: Basic Metabolic Panel:  Recent Labs Lab 02/06/14 1825  NA 138  K 4.1  CL 103  GLUCOSE 98  BUN 12  CREATININE 1.30   Liver Function Tests: No results found for this basename: AST, ALT,  ALKPHOS, BILITOT, PROT, ALBUMIN,  in the last 168 hours No results found for this basename: LIPASE, AMYLASE,  in the last 168 hours No results found for this basename: AMMONIA,  in the last 168 hours CBC:  Recent Labs Lab 02/06/14 1809 02/06/14 1825  WBC 4.3  --   NEUTROABS 2.2  --   HGB 12.7* 14.3  HCT 38.6* 42.0  MCV 86.9  --   PLT 234  --    Cardiac Enzymes: No results found for this basename: CKTOTAL, CKMB, CKMBINDEX, TROPONINI,  in the last 168 hours BNP: No results found for this basename: PROBNP,  in the last 168 hours D-Dimer: No results found for this basename: DDIMER,  in the last 168 hours CBG:  Recent Labs Lab 02/07/14 0716 02/07/14 1201 02/07/14 1631 02/07/14 2001 02/08/14 0722  GLUCAP 122* 106* 111* 119* 111*   Hemoglobin A1C:  Recent Labs Lab 02/06/14 1738  HGBA1C 5.9*   Fasting Lipid Panel: No results found for this basename: CHOL, HDL, LDLCALC, TRIG, CHOLHDL, LDLDIRECT,  in the last 168 hours Thyroid Function Tests: No results found for this basename: TSH, T4TOTAL, FREET4, T3FREE, THYROIDAB,  in the last 168 hours Coagulation: No results found for this basename: LABPROT, INR,  in the last 168 hours Anemia Panel: No results found for this basename: VITAMINB12, FOLATE, FERRITIN, TIBC, IRON, RETICCTPCT,  in the  last 168 hours Urine Drug Screen: Drugs of Abuse     Component Value Date/Time   LABOPIA NONE DETECTED 02/06/2014 1944   COCAINSCRNUR NONE DETECTED 02/06/2014 1944   LABBENZ NONE DETECTED 02/06/2014 1944   AMPHETMU NONE DETECTED 02/06/2014 1944   THCU NONE DETECTED 02/06/2014 1944   LABBARB NONE DETECTED 02/06/2014 1944    Alcohol Level: No results found for this basename: ETH,  in the last 168 hours Urinalysis:  Recent Labs Lab 02/06/14 1944  COLORURINE YELLOW  LABSPEC 1.022  PHURINE 5.5  GLUCOSEU NEGATIVE  HGBUR NEGATIVE  BILIRUBINUR NEGATIVE  KETONESUR NEGATIVE  PROTEINUR NEGATIVE  UROBILINOGEN 1.0  NITRITE NEGATIVE    LEUKOCYTESUR TRACE*     Micro Results: No results found for this or any previous visit (from the past 240 hour(s)). Studies/Results: Mr Herby Abraham Contrast  02/07/2014   CLINICAL DATA:  Stroke.  Hypertension and smoking  EXAM: MRI HEAD WITHOUT CONTRAST  MRA HEAD WITHOUT CONTRAST  TECHNIQUE: Multiplanar, multiecho pulse sequences of the brain and surrounding structures were obtained without intravenous contrast. Angiographic images of the head were obtained using MRA technique without contrast.  COMPARISON:  MRI 04/12/2008  FINDINGS: MRI HEAD FINDINGS  Negative for acute infarct.  Age advanced chronic microvascular ischemic changes have progressed since 2009. Multiple areas of chronic infarct are present throughout the cerebral Bhardwaj matter involving the subcortical and deep Crace matter. Chronic infarcts in the thalami bilaterally. Small focal chronic lacune in the left pons. Chronic micro hemorrhage in the right thalamus. No cortical infarct.  Negative for mass lesion. Ventricle size is normal. Cerebral volume is normal.  MRA HEAD FINDINGS  Both vertebral arteries are patent to the basilar. The basilar is widely patent. Superior cerebellar and posterior cerebral arteries are patent. Patent posterior communicating artery bilaterally with fetal origin of the left posterior cerebral artery on the left.  Internal carotid artery is patent bilaterally. Anterior and middle cerebral arteries are patent bilaterally. Atherosclerotic disease in the left A1 segment. Scattered areas of mild stenosis are present in the middle cerebral arteries bilaterally.  Negative for cerebral aneurysm.  IMPRESSION: Negative for acute infarct.  Age advanced chronic microvascular ischemic changes with progression since 2009.  Mild intracranial atherosclerotic disease.   Electronically Signed   By: Franchot Gallo M.D.   On: 02/07/2014 11:16   Mr Jodene Nam Head/brain Wo Cm  02/07/2014   CLINICAL DATA:  Stroke.  Hypertension and smoking   EXAM: MRI HEAD WITHOUT CONTRAST  MRA HEAD WITHOUT CONTRAST  TECHNIQUE: Multiplanar, multiecho pulse sequences of the brain and surrounding structures were obtained without intravenous contrast. Angiographic images of the head were obtained using MRA technique without contrast.  COMPARISON:  MRI 04/12/2008  FINDINGS: MRI HEAD FINDINGS  Negative for acute infarct.  Age advanced chronic microvascular ischemic changes have progressed since 2009. Multiple areas of chronic infarct are present throughout the cerebral [place] matter involving the subcortical and deep [place] matter. Chronic infarcts in the thalami bilaterally. Small focal chronic lacune in the left pons. Chronic micro hemorrhage in the right thalamus. No cortical infarct.  Negative for mass lesion. Ventricle size is normal. Cerebral volume is normal.  MRA HEAD FINDINGS  Both vertebral arteries are patent to the basilar. The basilar is widely patent. Superior cerebellar and posterior cerebral arteries are patent. Patent posterior communicating artery bilaterally with fetal origin of the left posterior cerebral artery on the left.  Internal carotid artery is patent bilaterally. Anterior and middle cerebral arteries are patent bilaterally. Atherosclerotic disease  in the left A1 segment. Scattered areas of mild stenosis are present in the middle cerebral arteries bilaterally.  Negative for cerebral aneurysm.  IMPRESSION: Negative for acute infarct.  Age advanced chronic microvascular ischemic changes with progression since 2009.  Mild intracranial atherosclerotic disease.   Electronically Signed   By: Franchot Gallo M.D.   On: 02/07/2014 11:16   Medications: I have reviewed the patient's current medications. Scheduled Meds: . atorvastatin  40 mg Oral q1800  . clotrimazole   Topical BID  . hydrochlorothiazide  25 mg Oral Daily  . lisinopril  10 mg Oral Daily  . research study medication  100 mg Oral Daily  . research study medication  90 mg Oral BID    Continuous Infusions:  PRN Meds:.acetaminophen, hydrALAZINE Assessment/Plan: Mr. Kistler is a 43yo man w/ PMHx of CVA in 2009, HTN, and HPL who presented to the ED with right-sided numbness and dizziness.   1. Suspected TIA: Pt presented with headache, numbness and tingling in right extremities, and the right side of his mouth associated with diaphoresis, dizziness and lightheadedness. All symptoms resolved within the span of several seconds with some residual numbness in right foot. Pt has a history of CVA in September of 2009. MRI/MRA showed two foci of acute infarction, the larger in the left thalamus/posterior limb internal capsule and the smaller in the right parietal subcortical Ebrahimi matter. Diffuse intracranial medium sized vessel atherosclerosis with focal stenosis of the left A1 segment. Considerable atherosclerotic irregularity of the basilar artery but without a dominant focal stenosis. Pt had residual numbness in 2nd finger of right hand after his CVA. Pt did not receive CT head w/o contrast in the ED. Dr. Leonel Ramsay, neurology, agreed with this decision since he was back at baseline. This AM, patient reported he had another episode of numbness in his right forearm while washing up at the sink. He states the numbness lasted for only 5 seconds with no residual numbness present currently. He states the numbness was only in his forearm this time, with no radiation into the hand or above the elbow. He had associated weakness in his right hand, where he felt he could not make a fist. This lasted for only 5 seconds. He denies numbness in his face or lower extremities. He denies changes in vision. Patient at risk for TIA with hx of previous TIA, elevated lipids (last lipid panel on 12/14/13 shows Chol 222, Trigly 180, HDL 70, LDL 116). Pt on Lovastatin 20 mg at home. Per neuro, patient apparently had hypercoagulable work-up during admission for previous stroke but unable to find results. MRI/MRA negative  for acute stroke, showed chronic microvascular ischemic changes. Carotid doppler showed mild stenosis (1-39%) in ICAs bilaterally. Neurology saw patient yesterday and enrolled patient into [employer] trial of aspirin vs. ticagrelor for secondary stroke prevention. He will have follow-up with Neurology in 1 week.  - f/u Echocardiogram report - Atorvastatin 40 mg daily - ASA 81 mg daily  - Patient will be discharged on research study aspirin/ticagrelor - HbA1c 5.9 - PT/OT consulted --> no further PT/OT needs - Neuro checks with vital signs  - Consider hypercoagulable work-up as outpatient  2. Tinea Corporis: Pt complains of bilateral lesions on lower extremities. He states he has had the lesions for over a year. He has not sought out any treatment for them. Multiple scab like lesions on proximal and distal anterior and medial areas, but also 2 larger lesions on the medial side of left lower extremity and lateral  side of right lower extremity. Lesions are 2 cm in diameter with [place], raised scaling skin change. Lesions extend up to lower thighs bilaterally. Pt states these are itchy only after he takes a hot shower. He was previously diagnosed with scabies in June 2015 and given permethrin cream. He states this helped, but he ran out. Lotrimin topical seems to be improving lesions. - Continue Clotrimazole 1% BID   3. HTN: BP 163/104 on admission. Now stable in 150s/90-100s. Patient takes HCTZ 25 mg daily and Lisinopril 10 mg daily at home. BP in 140-180s/90-100s.  - Restarted on home meds - Hydralazine 5 mg IV Q8H prn SBP>180 or DBP >110   4. HLD: Most recent lipid panel 12/14/13 shows cholesterol 222, TG 180, HDL 70, LDL 116. On Lovastatin 20 mg at home.  - Atorvastatin 40 mg daily   Diet: Heart healthy DVT/PE PPx: Lovenox 40 mg SQ daily  Dispo: Patient to be discharged today.   The patient does have a current PCP ([facility]) and does need an [facility] hospital follow-up appointment after  discharge.  The patient does not have transportation limitations that hinder transportation to clinic appointments.  .Services Needed at time of discharge: Y = Yes, Blank = No PT:   OT:   RN:   Equipment:   Other:     LOS: 2 days   Albin Felling, MD 02/08/2014, 7:50 AM

## 2014-02-08 NOTE — Discharge Instructions (Addendum)
STROKE/TIA DISCHARGE INSTRUCTIONS SMOKING Cigarette smoking nearly doubles your risk of having a stroke & is the single most alterable risk factor  If you smoke or have smoked in the last 12 months, you are advised to quit smoking for your health.  Most of the excess cardiovascular risk related to smoking disappears within a year of stopping.  Ask you doctor about anti-smoking medications  [place] Quit Line: 1-800-QUIT NOW  Free Smoking Cessation Classes [phone]  CHOLESTEROL Know your levels; limit fat & cholesterol in your diet  Lipid Panel     Component Value Date/Time   CHOL 222* 12/14/2013 0445   TRIG 180* 12/14/2013 0445   HDL 70 12/14/2013 0445   CHOLHDL 3.2 12/14/2013 0445   VLDL 36 12/14/2013 0445   LDLCALC 116* 12/14/2013 0445      Many patients benefit from treatment even if their cholesterol is at goal.  Goal: Total Cholesterol (CHOL) less than 160  Goal:  Triglycerides (TRIG) less than 150  Goal:  HDL greater than 40  Goal:  LDL (LDLCALC) less than 100   BLOOD PRESSURE American Stroke Association blood pressure target is less that 120/80 mm/Hg  Your discharge blood pressure is:  BP: 145/104 mmHg  Monitor your blood pressure  Limit your salt and alcohol intake  Many individuals will require more than one medication for high blood pressure  DIABETES (A1c is a blood sugar average for last 3 months) Goal HGBA1c is under 7% (HBGA1c is blood sugar average for last 3 months)  Diabetes: No known diagnosis of diabetes    Lab Results  Component Value Date   HGBA1C 5.9* 02/06/2014     Your HGBA1c can be lowered with medications, healthy diet, and exercise.  Check your blood sugar as directed by your physician  Call your physician if you experience unexplained or low blood sugars.  PHYSICAL ACTIVITY/REHABILITATION Goal is 30 minutes at least 4 days per week  Activity: Increase activity slowly, Therapies: None Return to work:   Activity decreases your risk of heart attack  and stroke and makes your heart stronger.  It helps control your weight and blood pressure; helps you relax and can improve your mood.  Participate in a regular exercise program.  Talk with your doctor about the best form of exercise for you (dancing, walking, swimming, cycling).  DIET/WEIGHT Goal is to maintain a healthy weight  Your discharge diet is: Cardiac Your height is:  Height: 5\' 5"  (165.1 cm) Your current weight is: Weight: 78.198 kg (172 lb 6.3 oz) Your Body Mass Index (BMI) is:  BMI (Calculated): 28.8  Following the type of diet specifically designed for you will help prevent another stroke.  Your goal weight range is:  114-144 lbs  Your goal Body Mass Index (BMI) is 19-24.  Healthy food habits can help reduce 3 risk factors for stroke:  High cholesterol, hypertension, and excess weight.  RESOURCES Stroke/Support Group:  Call [phone]   STROKE EDUCATION PROVIDED/REVIEWED AND GIVEN TO PATIENT Stroke warning signs and symptoms How to activate emergency medical system (call 911). Medications prescribed at discharge. Need for follow-up after discharge. Personal risk factors for stroke. Pneumonia vaccine given: No Flu vaccine given: No My questions have been answered, the writing is legible, and I understand these instructions.  I will adhere to these goals & educational materials that have been provided to me after my discharge from the hospital.

## 2014-02-08 NOTE — Progress Notes (Signed)
Pt BP 158/110 manually prior to discharge. Dr. Arcelia Jew notified. Orders received to give scheduled BP medication and proceed with discharge. Glenn Newton

## 2014-02-08 NOTE — Discharge Summary (Signed)
Name: Glenn Newton MRN: [id number] DOB: May 17, 1971 43 y.o. PCP: No Pcp Per Patient  Date of Admission: 02/06/2014  4:47 PM Date of Discharge: 02/08/2014 Attending Physician: Madilyn Fireman, MD  Discharge Diagnosis: 1. Suspected TIA 2. Tinea Corporis 3. HTN 4. HLD  Discharge Medications:   Medication List    STOP taking these medications       aspirin 81 MG EC tablet     lovastatin 20 MG tablet  Commonly known as:  MEVACOR      TAKE these medications       atorvastatin 40 MG tablet  Commonly known as:  LIPITOR  Take 1 tablet (40 mg total) by mouth daily at 6 PM.     clotrimazole 1 % cream  Commonly known as:  LOTRIMIN  Apply topically 2 (two) times daily.     hydrochlorothiazide 25 MG tablet  Commonly known as:  HYDRODIURIL  Take 1 tablet (25 mg total) by mouth daily.     lisinopril 10 MG tablet  Commonly known as:  PRINIVIL,ZESTRIL  Take 1 tablet (10 mg total) by mouth daily.        Disposition and follow-up:   Glenn Newton was discharged from [facility] in Good condition.  At the hospital follow up visit please address:  1.  TIA symptoms. Tinea Corporis on lower extremities. Echocardiogram results from 02/07/14.  2.  Labs / imaging needed at time of follow-up: None  3.  Pending labs/ test needing follow-up: Echocardiogram 02/07/14 results.  Follow-up Appointments: Follow-up Information   Follow up with Xu,Jindong, MD. Schedule an appointment as soon as possible for a visit in 2 months. (Stroke Clinic)    Specialty:  Neurology   Contact information:   [address] [place] [id number] [phone]       Follow up with Albin Felling, MD. (August 13th @ 1:30PM)    Specialty:  Internal Medicine   Contact information:   [place] [id number] [phone]       Discharge Instructions: Discharge Instructions   Diet - low sodium heart healthy    Complete by:  As directed      Increase activity  slowly    Complete by:  As directed            Consultations: Treatment Team:  Md Stroke, MD  Procedures Performed:  Mr Brain Wo Contrast  02/07/2014   CLINICAL DATA:  Stroke.  Hypertension and smoking  EXAM: MRI HEAD WITHOUT CONTRAST  MRA HEAD WITHOUT CONTRAST  TECHNIQUE: Multiplanar, multiecho pulse sequences of the brain and surrounding structures were obtained without intravenous contrast. Angiographic images of the head were obtained using MRA technique without contrast.  COMPARISON:  MRI 04/12/2008  FINDINGS: MRI HEAD FINDINGS  Negative for acute infarct.  Age advanced chronic microvascular ischemic changes have progressed since 2009. Multiple areas of chronic infarct are present throughout the cerebral Castile matter involving the subcortical and deep Tippins matter. Chronic infarcts in the thalami bilaterally. Small focal chronic lacune in the left pons. Chronic micro hemorrhage in the right thalamus. No cortical infarct.  Negative for mass lesion. Ventricle size is normal. Cerebral volume is normal.  MRA HEAD FINDINGS  Both vertebral arteries are patent to the basilar. The basilar is widely patent. Superior cerebellar and posterior cerebral arteries are patent. Patent posterior communicating artery bilaterally with fetal origin of the left posterior cerebral artery on the left.  Internal carotid artery is patent bilaterally.  Anterior and middle cerebral arteries are patent bilaterally. Atherosclerotic disease in the left A1 segment. Scattered areas of mild stenosis are present in the middle cerebral arteries bilaterally.  Negative for cerebral aneurysm.  IMPRESSION: Negative for acute infarct.  Age advanced chronic microvascular ischemic changes with progression since 2009.  Mild intracranial atherosclerotic disease.   Electronically Signed   By: Franchot Gallo M.D.   On: 02/07/2014 11:16   Mr Jodene Nam Head/brain Wo Cm  02/07/2014   CLINICAL DATA:  Stroke.  Hypertension and smoking  EXAM: MRI HEAD  WITHOUT CONTRAST  MRA HEAD WITHOUT CONTRAST  TECHNIQUE: Multiplanar, multiecho pulse sequences of the brain and surrounding structures were obtained without intravenous contrast. Angiographic images of the head were obtained using MRA technique without contrast.  COMPARISON:  MRI 04/12/2008  FINDINGS: MRI HEAD FINDINGS  Negative for acute infarct.  Age advanced chronic microvascular ischemic changes have progressed since 2009. Multiple areas of chronic infarct are present throughout the cerebral Burroughs matter involving the subcortical and deep Regal matter. Chronic infarcts in the thalami bilaterally. Small focal chronic lacune in the left pons. Chronic micro hemorrhage in the right thalamus. No cortical infarct.  Negative for mass lesion. Ventricle size is normal. Cerebral volume is normal.  MRA HEAD FINDINGS  Both vertebral arteries are patent to the basilar. The basilar is widely patent. Superior cerebellar and posterior cerebral arteries are patent. Patent posterior communicating artery bilaterally with fetal origin of the left posterior cerebral artery on the left.  Internal carotid artery is patent bilaterally. Anterior and middle cerebral arteries are patent bilaterally. Atherosclerotic disease in the left A1 segment. Scattered areas of mild stenosis are present in the middle cerebral arteries bilaterally.  Negative for cerebral aneurysm.  IMPRESSION: Negative for acute infarct.  Age advanced chronic microvascular ischemic changes with progression since 2009.  Mild intracranial atherosclerotic disease.   Electronically Signed   By: Franchot Gallo M.D.   On: 02/07/2014 11:16    2D Echo on 02/07/14: Results pending   Admission HPI: Glenn Newton is a 43 yo male with PMHx of CVA in 2009, HTN, HLD and tobacco abuse who presents to the ED with complaint of numbness and lightheadedness. Pt states he went to work at 4 pm today. When he arrived, he had a brief episode of numbness and tingling in his right upper and  lower extremity. The numbness quickly resolved. 5 minutes later, the numbness and tingling returned and also included the right side of his mouth. He felt sweaty, dizzy and lightheaded and thought he might pass out. This episode lasted for several seconds and then resolved on its own. His supervisor called EMS. Pt admits to headache and right sided weakness, but denies any vision or hearing loss, no facial droop or trouble speaking. He did not have a syncopal episode. Pt states his symptoms resolved in a few seconds, but he still has some numbness and tingling in his right foot. Pt has a history of CVA in 2009, which he states his symptoms were similar in nature at the time with right sided numbness and tingling. His only deficit from the CVA was numbness in his 2nd finger in his right hand. Pt has been taking all of his medications as prescribed, but he ran out of his ASA 81 mg 2 weeks ago and has been substituting it with ASA 325 mg daily.    Hospital Course by problem list:  1. Suspected TIA: Pt presented with headache, numbness and tingling in right extremities,  and the right side of his mouth associated with diaphoresis, dizziness and lightheadedness. All symptoms resolved within the span of several seconds with some residual numbness in right foot. Pt has a history of CVA in September of 2009. MRI/MRA showed two foci of acute infarction, the larger in the left thalamus/posterior limb internal capsule and the smaller in the right parietal subcortical Goodell matter. Diffuse intracranial medium sized vessel atherosclerosis with focal stenosis of the left A1 segment. Considerable atherosclerotic irregularity of the basilar artery but without a dominant focal stenosis. Pt had residual numbness in 2nd finger of right hand after his CVA. Pt did not receive CT head w/o contrast in the ED. Dr. Leonel Ramsay, neurology, agreed with this decision since he was back at baseline. Yesterday (7/29), patient reported he had  another episode of numbness in his right forearm while washing up at the sink. He states the numbness lasted for only 5 seconds with no residual numbness present currently. He states the numbness was only in his forearm this time, with no radiation into the hand or above the elbow. He had associated weakness in his right hand, where he felt he could not make a fist. This lasted for only 5 seconds. He denies numbness in his face or lower extremities. He denies changes in vision. Patient at risk for TIA with hx of previous TIA, elevated lipids (last lipid panel on 12/14/13 shows Chol 222, Trigly 180, HDL 70, LDL 116). Pt on Lovastatin 20 mg at home. HbA1c 5.9. Per neuro, patient apparently had hypercoagulable work-up during admission for previous stroke but unable to find results. MRI/MRA negative for acute stroke, showed chronic microvascular ischemic changes. Carotid doppler showed mild stenosis (1-39%) in ICAs bilaterally. Echocardiogram 7/29 results pending. Neurology saw patient yesterday and enrolled patient into [employer] trial of aspirin vs. ticagrelor for secondary stroke prevention. He will have follow-up with Neurology in 1 week.  Patient was discharged on Atorvastatin 40 mg daily as well as medications for research trial. Patient may benefit from hypercoagulable work up as outpatient.  2. Tinea Corporis: Pt complains of bilateral lesions on lower extremities. He states he has had the lesions for over a year. He has not sought out any treatment for them. Multiple scab like lesions on proximal and distal anterior and medial areas, but also 2 larger lesions on the medial side of left lower extremity and lateral side of right lower extremity. Lesions are 2 cm in diameter with [place], raised scaling skin change. Lesions extend up to lower thighs bilaterally. Pt states these are itchy only after he takes a hot shower. He was previously diagnosed with scabies in June 2015 and given permethrin cream. He states this  helped, but he ran out. Lotrimin topical seems to be improving lesions. Will continue Lotrimin on discharge.  3. HTN: BP 163/104 on admission. Now stable in 150s/90-100s. Patient takes HCTZ 25 mg daily and Lisinopril 10 mg daily at home. BP in 140-180s/90-100s. Patient restarted on home meds on 02/07/14 after MRI/MRA negative for acute stroke.   4. HLD: Most recent lipid panel 12/14/13 shows cholesterol 222, TG 180, HDL 70, LDL 116. On Lovastatin 20 mg at home. Patient was switched to Atorvastatin 40 mg daily for better control of lipids. Atorvastatin to be continued on discharge.     Discharge Vitals:   BP 145/104  Pulse 60  Temp(Src) 97.8 F (36.6 C) (Oral)  Resp 17  Ht 5\' 5"  (1.651 m)  Wt 172 lb 6.3 oz (78.198 kg)  BMI 28.69 kg/m2  SpO2 100% Physical Exam  General: alert, cooperative, sitting up in bed  HEENT: Pinch/AT, EOMI, PERRLA, conjunctiva normal, mucus membranes moist  Neck: Supple, no JVD  CV: RRR, normal S1/S2, no m/g/r  Pulm: CTA bilaterally  Abd: BS+, soft, non-distended, non-tender  Ext: moves all, warm, no edema. Patient has multiple lesions on bilateral lower extremities. Lesions look chronic and scabbed over. 2 larger lesions on medial side of left lower extremity and lateral side of right lower extremity. Lesions are 2 cm in diameter with [place], raised scaling skin change. Lesions extend to lower thigh. Lesions appear to be improving with Lotrimin cream.  Neuro: alert and oriented x 3, strength 5/5 in upper and lower extremities, CNs II-XII intact  Discharge Labs:  Results for orders placed during the hospital encounter of 02/06/14 (from the past 24 hour(s))  GLUCOSE, CAPILLARY     Status: Abnormal   Collection Time    02/07/14 12:01 PM      Result Value Ref Range   Glucose-Capillary 106 (*) 70 - 99 mg/dL  GLUCOSE, CAPILLARY     Status: Abnormal   Collection Time    02/07/14  4:31 PM      Result Value Ref Range   Glucose-Capillary 111 (*) 70 - 99 mg/dL  GLUCOSE,  CAPILLARY     Status: Abnormal   Collection Time    02/07/14  8:01 PM      Result Value Ref Range   Glucose-Capillary 119 (*) 70 - 99 mg/dL  GLUCOSE, CAPILLARY     Status: Abnormal   Collection Time    02/08/14  7:22 AM      Result Value Ref Range   Glucose-Capillary 111 (*) 70 - 99 mg/dL    Signed: Albin Felling, MD 02/08/2014, 10:25 AM    Services Ordered on Discharge: None Equipment Ordered on Discharge: None

## 2014-02-08 NOTE — Progress Notes (Signed)
Pt discharged to home per MD order. Pt received and reviewed all discharge instructions and medication information including follow-up appointments and prescription information. Pt verbalized understanding. Pt IV and telemetry box removed prior to discharge. Pt alert and oriented at discharge with no complaints of pain. Pt escorted to private vehicle via wheelchair by guest services volunteer. Glenn Newton

## 2014-02-08 NOTE — Progress Notes (Signed)
I discussed the plan of care for this patient with Dr Albin Felling and Dr Algis Liming. I have reviewed Dr Rivet's note and I agree with the assessment and plan.

## 2014-02-09 NOTE — Discharge Summary (Signed)
INTERNAL MEDICINE ATTENDING DISCHARGE COSIGN   I evaluated the patient on the day of discharge and discussed the discharge plan with my resident team. I agree with the discharge documentation and disposition.   Madilyn Fireman 02/09/2014, 12:45 PM

## 2014-02-22 ENCOUNTER — Ambulatory Visit: Payer: Self-pay | Admitting: Internal Medicine

## 2014-03-28 ENCOUNTER — Emergency Department (HOSPITAL_COMMUNITY)
Admission: EM | Admit: 2014-03-28 | Discharge: 2014-03-28 | Disposition: A | Discharge status: Home / Self Care | Payer: Self-pay | Attending: Emergency Medicine | Admitting: Emergency Medicine

## 2014-03-28 ENCOUNTER — Emergency Department (HOSPITAL_COMMUNITY): Payer: Self-pay

## 2014-03-28 DIAGNOSIS — Y9389 Activity, other specified: Secondary | ICD-10-CM | POA: Insufficient documentation

## 2014-03-28 DIAGNOSIS — S298XXA Other specified injuries of thorax, initial encounter: Secondary | ICD-10-CM | POA: Insufficient documentation

## 2014-03-28 DIAGNOSIS — Z79899 Other long term (current) drug therapy: Secondary | ICD-10-CM | POA: Insufficient documentation

## 2014-03-28 DIAGNOSIS — I10 Essential (primary) hypertension: Secondary | ICD-10-CM | POA: Insufficient documentation

## 2014-03-28 DIAGNOSIS — E7800 Pure hypercholesterolemia, unspecified: Secondary | ICD-10-CM | POA: Insufficient documentation

## 2014-03-28 DIAGNOSIS — Z87891 Personal history of nicotine dependence: Secondary | ICD-10-CM | POA: Insufficient documentation

## 2014-03-28 DIAGNOSIS — Z8673 Personal history of transient ischemic attack (TIA), and cerebral infarction without residual deficits: Secondary | ICD-10-CM | POA: Insufficient documentation

## 2014-03-28 DIAGNOSIS — Y9289 Other specified places as the place of occurrence of the external cause: Secondary | ICD-10-CM | POA: Insufficient documentation

## 2014-03-28 DIAGNOSIS — R0789 Other chest pain: Secondary | ICD-10-CM

## 2014-03-28 DIAGNOSIS — R296 Repeated falls: Secondary | ICD-10-CM | POA: Insufficient documentation

## 2014-03-28 LAB — CBC WITH DIFFERENTIAL/PLATELET
BASOS ABS: 0 10*3/uL (ref 0.0–0.1)
Basophils Relative: 1 % (ref 0–1)
EOS ABS: 0.1 10*3/uL (ref 0.0–0.7)
EOS PCT: 3 % (ref 0–5)
HCT: 37.7 % — ABNORMAL LOW (ref 39.0–52.0)
Hemoglobin: 13 g/dL (ref 13.0–17.0)
LYMPHS ABS: 1.7 10*3/uL (ref 0.7–4.0)
LYMPHS PCT: 39 % (ref 12–46)
MCH: 29.3 pg (ref 26.0–34.0)
MCHC: 34.5 g/dL (ref 30.0–36.0)
MCV: 85.1 fL (ref 78.0–100.0)
Monocytes Absolute: 0.5 10*3/uL (ref 0.1–1.0)
Monocytes Relative: 11 % (ref 3–12)
NEUTROS PCT: 46 % (ref 43–77)
Neutro Abs: 2 10*3/uL (ref 1.7–7.7)
PLATELETS: 242 10*3/uL (ref 150–400)
RBC: 4.43 MIL/uL (ref 4.22–5.81)
RDW: 14 % (ref 11.5–15.5)
WBC: 4.4 10*3/uL (ref 4.0–10.5)

## 2014-03-28 LAB — COMPREHENSIVE METABOLIC PANEL
ALK PHOS: 86 U/L (ref 39–117)
ALT: 14 U/L (ref 0–53)
AST: 24 U/L (ref 0–37)
Albumin: 3.6 g/dL (ref 3.5–5.2)
Anion gap: 15 (ref 5–15)
BUN: 12 mg/dL (ref 6–23)
CO2: 25 mEq/L (ref 19–32)
Calcium: 9.3 mg/dL (ref 8.4–10.5)
Chloride: 100 mEq/L (ref 96–112)
Creatinine, Ser: 1.09 mg/dL (ref 0.50–1.35)
GFR calc Af Amer: >90 mL/min (ref >90)
GFR calc non Af Amer: 82 mL/min — ABNORMAL LOW (ref >90)
GLUCOSE: 107 mg/dL — AB (ref 70–99)
POTASSIUM: 3.8 meq/L (ref 3.7–5.3)
SODIUM: 140 meq/L (ref 137–147)
Total Bilirubin: 0.3 mg/dL (ref 0.3–1.2)
Total Protein: 7.3 g/dL (ref 6.0–8.3)

## 2014-03-28 LAB — I-STAT TROPONIN, ED: TROPONIN I, POC: 0.01 ng/mL (ref 0.00–0.08)
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
## 2014-03-28 NOTE — Discharge Instructions (Signed)
Take two Naproxen tablets at a time, twice a day. Alternatively, take four Ibuprofen tablets at a time, three times a day. Monitor your blood pressure at home.  Chest Wall Pain Chest wall pain is pain in or around the bones and muscles of your chest. It may take up to 6 weeks to get better. It may take longer if you must stay physically active in your work and activities.  CAUSES  Chest wall pain may happen on its own. However, it may be caused by:  A viral illness like the flu.  Injury.  Coughing.  Exercise.  Arthritis.  Fibromyalgia.  Shingles. HOME CARE INSTRUCTIONS   Avoid overtiring physical activity. Try not to strain or perform activities that cause pain. This includes any activities using your chest or your abdominal and side muscles, especially if heavy weights are used.  Put ice on the sore area.  Put ice in a plastic bag.  Place a towel between your skin and the bag.  Leave the ice on for 15-20 minutes per hour while awake for the first 2 days.  Only take over-the-counter or prescription medicines for pain, discomfort, or fever as directed by your caregiver. SEEK IMMEDIATE MEDICAL CARE IF:   Your pain increases, or you are very uncomfortable.  You have a fever.  Your chest pain becomes worse.  You have new, unexplained symptoms.  You have nausea or vomiting.  You feel sweaty or lightheaded.  You have a cough with phlegm (sputum), or you cough up blood. MAKE SURE YOU:   Understand these instructions.  Will watch your condition.  Will get help right away if you are not doing well or get worse. Document Released: 06/29/2005 Document Revised: 09/21/2011 Document Reviewed: 02/23/2011 [facility] Patient Information 2015 [place], [place]. This information is not intended to replace advice given to you by your health care provider. Make sure you discuss any questions you have with your health care provider.  Hypertension Hypertension, commonly called high  blood pressure, is when the force of blood pumping through your arteries is too strong. Your arteries are the blood vessels that carry blood from your heart throughout your body. A blood pressure reading consists of a higher number over a lower number, such as 110/72. The higher number (systolic) is the pressure inside your arteries when your heart pumps. The lower number (diastolic) is the pressure inside your arteries when your heart relaxes. Ideally you want your blood pressure below 120/80. Hypertension forces your heart to work harder to pump blood. Your arteries may become narrow or stiff. Having hypertension puts you at risk for heart disease, stroke, and other problems.  RISK FACTORS Some risk factors for high blood pressure are controllable. Others are not.  Risk factors you cannot control include:   Race. You may be at higher risk if you are African American.  Age. Risk increases with age.  Gender. Men are at higher risk than women before age 28 years. After age 22, women are at higher risk than men. Risk factors you can control include:  Not getting enough exercise or physical activity.  Being overweight.  Getting too much fat, sugar, calories, or salt in your diet.  Drinking too much alcohol. SIGNS AND SYMPTOMS Hypertension does not usually cause signs or symptoms. Extremely high blood pressure (hypertensive crisis) may cause headache, anxiety, shortness of breath, and nosebleed. DIAGNOSIS  To check if you have hypertension, your health care provider will measure your blood pressure while you are seated, with your  arm held at the level of your heart. It should be measured at least twice using the same arm. Certain conditions can cause a difference in blood pressure between your right and left arms. A blood pressure reading that is higher than normal on one occasion does not mean that you need treatment. If one blood pressure reading is high, ask your health care provider about  having it checked again. TREATMENT  Treating high blood pressure includes making lifestyle changes and possibly taking medicine. Living a healthy lifestyle can help lower high blood pressure. You may need to change some of your habits. Lifestyle changes may include:  Following the DASH diet. This diet is high in fruits, vegetables, and whole grains. It is low in salt, red meat, and added sugars.  Getting at least 2 hours of brisk physical activity every week.  Losing weight if necessary.  Not smoking.  Limiting alcoholic beverages.  Learning ways to reduce stress. If lifestyle changes are not enough to get your blood pressure under control, your health care provider may prescribe medicine. You may need to take more than one. Work closely with your health care provider to understand the risks and benefits. HOME CARE INSTRUCTIONS  Have your blood pressure rechecked as directed by your health care provider.   Take medicines only as directed by your health care provider. Follow the directions carefully. Blood pressure medicines must be taken as prescribed. The medicine does not work as well when you skip doses. Skipping doses also puts you at risk for problems.   Do not smoke.   Monitor your blood pressure at home as directed by your health care provider. SEEK MEDICAL CARE IF:   You think you are having a reaction to medicines taken.  You have recurrent headaches or feel dizzy.  You have swelling in your ankles.  You have trouble with your vision. SEEK IMMEDIATE MEDICAL CARE IF:  You develop a severe headache or confusion.  You have unusual weakness, numbness, or feel faint.  You have severe chest or abdominal pain.  You vomit repeatedly.  You have trouble breathing. MAKE SURE YOU:   Understand these instructions.  Will watch your condition.  Will get help right away if you are not doing well or get worse. Document Released: 06/29/2005 Document Revised:  11/13/2013 Document Reviewed: 04/21/2013 [facility] Patient Information 2015 [place], [place]. This information is not intended to replace advice given to you by your health care provider. Make sure you discuss any questions you have with your health care provider.   Emergency Department Resource Guide 1) Find a Doctor and Pay Out of Pocket Although you won't have to find out who is covered by your insurance plan, it is a good idea to ask around and get recommendations. You will then need to call the office and see if the doctor you have chosen will accept you as a new patient and what types of options they offer for patients who are self-pay. Some doctors offer discounts or will set up payment plans for their patients who do not have insurance, but you will need to ask so you aren't surprised when you get to your appointment.  2) Contact Your Local Health Department Not all health departments have doctors that can see patients for sick visits, but many do, so it is worth a call to see if yours does. If you don't know where your local health department is, you can check in your phone book. The CDC also has a tool  to help you locate your state's health department, and many state websites also have listings of all of their local health departments.  3) Find a [facility] If your illness is not likely to be very severe or complicated, you may want to try a walk in clinic. These are popping up all over the country in pharmacies, drugstores, and shopping centers. They're usually staffed by nurse practitioners or physician assistants that have been trained to treat common illnesses and complaints. They're usually fairly quick and inexpensive. However, if you have serious medical issues or chronic medical problems, these are probably not your best option.  No Primary Care Doctor: - Call Health Connect at  [phone] - they can help you locate a primary care doctor that  accepts your insurance, provides certain  services, etc. - Physician Referral Service- [phone]  Chronic Pain Problems: Organization         Address  Phone   Notes  [facility]  [phone] Patients need to be referred by their primary care doctor.   Medication Assistance: Organization         Address  Phone   Notes  [facility] Medication [facility] [place], [place] [address] [phone] --Must be a resident of [facility] -- Must have NO insurance coverage whatsoever (no Medicaid/ Medicare, etc.) -- The pt. MUST have a primary care doctor that directs their care regularly and follows them in the community   MedAssist  [phone]   [employer]  [phone]    Agencies that provide inexpensive medical care: Organization         Address  Phone   Notes  [place]  [phone]   [facility]    [phone]   [facility] [place], [place] [address] [phone]   [place] [address] [place] [phone]   Planned Parenthood    [phone]   [facility]    [phone]   [place] and [address], [place] Phone:  [phone], Fax:  [phone] Hours of Operation:  9 am - 6 pm, M-F.  Also accepts Medicaid/Medicare and self-pay.  [facility] for [place], [address], [place] Phone: [phone], Fax: [phone]. Hours of Operation:  8:30 am - 5:30 pm, M-F.  Also accepts Medicaid and self-pay.  [facility] [address], [place] Phone: [phone]   [place], [place], [place] [phone], Ext. [phone] Mondays & Thursdays: 7-9 AM.  First 15 patients are seen on a first come, first serve basis.    [place] Providers:  Organization         Address  Phone   Notes  [facility] [address], [place] [phone] Also accepts self-pay patients.  [facility] [address], [place]  [phone]   [place], [address], [place] [phone]   [facility] [address], [place] [phone]   Lucianne Lei [address], [place]   [phone] Only accepts [place] Access [place] patients after they have their name applied to their card.   Self-Pay (no insurance) in [place]:  Organization         Address  Phone   Notes  Sickle Cell Patients, [facility] [place] [phone]   [facility] [place] [phone]   [facility] [place]  [place], [address], [place] [phone]   [facility]/Dr. Osei-Bonsu  [address], [place] or [address], [place] [phone] Phone number for both [place] and [place] locations is the same.  [facility] [address] [place] [phone]   [facility] [address], [place] or [address] [phone]   [facility] [address] [place] [phone], phone; [phone], fax Sees patients 1st and 3rd Saturday of every month.  Must not qualify for public or private insurance (i.e. Medicaid, Medicare, [place] Health Choice, Veterans' Benefits)  Household income should be no more than 200% of the poverty level The clinic cannot treat you if you are pregnant or think you are pregnant  Sexually transmitted diseases are not treated at the clinic.    Dental Care: Organization         Address  Phone  Notes  [facility] Department of [facility] [phone] Accepts  children up to age 58 who are enrolled in [place] or [place]; pregnant women with a Medicaid card; and children who have applied for Medicaid or [place] Health Choice, but were declined, whose parents can pay a reduced fee at time of service.  [facility]  [address], [place] [phone] Accepts children up to age 64 who are enrolled in [place] or [place]; pregnant women with a Medicaid card; and children who have applied for Medicaid or [place] Health Choice, but were declined, whose parents can pay a reduced fee at time of service.  [place] Adult Dental Access PROGRAM  [place] [phone] Patients are seen by appointment only. Walk-ins are not accepted. [place] will see patients 80 years of age and older. Monday - Tuesday (8am-5pm) Most Wednesdays (8:30-5pm) $30 per visit, cash only  [facility] Adult Dental Access PROGRAM  [address], [facility] [phone] Patients are seen by appointment only. Walk-ins are not accepted. [place] will see patients 36 years of age and older. One Wednesday Evening (Monthly: Volunteer Based).  $30 per visit, cash only  [place]  [phone] for adults; Children under age 46, call Graduate Pediatric Dentistry at [phone]. Children aged 59-14, please call [phone] to request a pediatric application.  Dental services are provided in all areas of dental care including fillings, crowns and bridges, complete and partial dentures, implants, gum treatment, root canals, and extractions. Preventive care is also provided. Treatment is provided to both adults and children. Patients are selected via a lottery and there is often a waiting list.   [facility] [address] [place]  [phone] [email or web address]   [facility] [address] [place], [place] [phone], Ext. [phone] Second and  Fourth Thursday of each month, opens at 6:30 AM; Clinic ends at 9 AM.  Patients are seen on a first-come first-served basis, and a limited number are seen during each clinic.  [facility]  [address] Hillard Danker [place], [place] [phone]   Eligibility Requirements You must have lived in [place], [place], or [place] counties for at least the last three months.   You cannot be eligible for state or federal sponsored [employer], including [employer], [place], or [employer].   You generally cannot be eligible for healthcare insurance through your employer.    How to apply: Eligibility screenings are held every Tuesday and Wednesday afternoon from 1:00 pm until 4:00 pm. You do not need an appointment for the interview!  [facility] [address], [place], [place]   [place]  [place] Department  [place]  [phone]    Behavioral Health Resources in the Community: Intensive Outpatient Programs Organization         Address  Phone  Notes  [place]. [address] [place], [place] [phone]   [facility] [address], [place], [place]   ADS: Alcohol & Drug S[facility] [address] [place], [place]   [place] [address]  [place], [place] or [phone]   Substance Abuse Resources Organization         Address  Phone  Notes  Alcohol and Drug Services  [phone]   [place]  [phone]   [facility] [place]   [place]  [phone]   Residential & Outpatient Substance Abuse Program  [phone]   Psychological Services Organization         Address  Phone  Notes  [facility] [place]  [place]  [phone]   [place]  [address] [place] or [phone]    Mobile Crisis Teams Organization         Address  Phone  Notes  [facility], Mobile Crisis Care Unit  [phone]   [facility]ive Psychotherapeutic Services  [address] [place], [place]   [place] [address], [place] [phone]    Self-Help/Support Groups Organization         Address  Phone             Notes  [place] - variety of support groups  [place] Call for more information  Narcotics Anonymous (NA), [facility]aring [facility] [address], [employer] [place]  2 meetings at this location   Special educational needs teacher         Address  Phone  Notes  [facility] [place],    [place]  [phone]   [facility]  [address] [place] [phone], [place], [place]   [place], [place] [phone] Admissions: 8am-3pm M-F  Incentives Substance [place] [address]    [place], [place] [phone]   [facility] [address] Jadene Pierini [place], [place]   [facility] [address]  [place], [place]   [facility] - Intensive Outpatient [address], [place], [place]   [facility] ([place].,  [place], [place] or [phone]   [facility] ([facility]) [address], [place], [place] Accepts Medicaid  [facility] [place] [address],  [place] [phone] Substance Abuse/Addiction Treatment  [facility] Organization         Address  Phone  Notes  [facility]  [phone]   Domenic Schwab, PhD [address] Arlis Porta [place], [place]   [phone] or [phone]   [facility], [place] [phone]   [place] [address], [place], [place] [phone] Insurance/Medicaid/sponsorship through [facility] and Families [address], [address] [place]                                    [place], [place] [phone] [place] [address], [place] [phone]    Dr. Adele Schilder  [phone]   [facility] Dept. 1) [address] [place] 2) [place] 3)  [place] [address], [place] [phone]  [phone]   [place] [phone] or [phone] (After Hours)

## 2014-03-28 NOTE — ED Notes (Signed)
Pt states cp pain gets bad with movement or when is at work, gets better when he is resting.

## 2014-04-17 ENCOUNTER — Telehealth: Payer: Self-pay | Admitting: Neurology

## 2014-04-17 NOTE — Telephone Encounter (Signed)
Left pt. A message to return my call regarding the study trial.

## 2014-06-28 ENCOUNTER — Telehealth: Payer: Self-pay

## 2015-08-13 ENCOUNTER — Emergency Department (HOSPITAL_COMMUNITY): Payer: BLUE CROSS/BLUE SHIELD

## 2015-08-13 ENCOUNTER — Observation Stay (HOSPITAL_COMMUNITY): Payer: BLUE CROSS/BLUE SHIELD

## 2015-08-13 ENCOUNTER — Encounter (HOSPITAL_COMMUNITY): Payer: Self-pay | Admitting: Cardiology

## 2015-08-13 ENCOUNTER — Observation Stay (HOSPITAL_COMMUNITY)
Admission: EM | Admit: 2015-08-13 | Discharge: 2015-08-14 | Disposition: A | Discharge status: Home / Self Care | Payer: BLUE CROSS/BLUE SHIELD | Attending: Internal Medicine | Admitting: Internal Medicine

## 2015-08-13 DIAGNOSIS — Z8249 Family history of ischemic heart disease and other diseases of the circulatory system: Secondary | ICD-10-CM | POA: Insufficient documentation

## 2015-08-13 DIAGNOSIS — F1090 Alcohol use, unspecified, uncomplicated: Secondary | ICD-10-CM

## 2015-08-13 DIAGNOSIS — E785 Hyperlipidemia, unspecified: Secondary | ICD-10-CM | POA: Diagnosis not present

## 2015-08-13 DIAGNOSIS — I10 Essential (primary) hypertension: Secondary | ICD-10-CM

## 2015-08-13 DIAGNOSIS — F17200 Nicotine dependence, unspecified, uncomplicated: Secondary | ICD-10-CM

## 2015-08-13 DIAGNOSIS — E7800 Pure hypercholesterolemia, unspecified: Secondary | ICD-10-CM | POA: Insufficient documentation

## 2015-08-13 DIAGNOSIS — Z7982 Long term (current) use of aspirin: Secondary | ICD-10-CM | POA: Insufficient documentation

## 2015-08-13 DIAGNOSIS — Z8673 Personal history of transient ischemic attack (TIA), and cerebral infarction without residual deficits: Secondary | ICD-10-CM | POA: Insufficient documentation

## 2015-08-13 DIAGNOSIS — G459 Transient cerebral ischemic attack, unspecified: Secondary | ICD-10-CM | POA: Diagnosis not present

## 2015-08-13 DIAGNOSIS — Z789 Other specified health status: Secondary | ICD-10-CM

## 2015-08-13 DIAGNOSIS — R42 Dizziness and giddiness: Secondary | ICD-10-CM | POA: Diagnosis not present

## 2015-08-13 DIAGNOSIS — I160 Hypertensive urgency: Secondary | ICD-10-CM | POA: Diagnosis not present

## 2015-08-13 DIAGNOSIS — F17210 Nicotine dependence, cigarettes, uncomplicated: Secondary | ICD-10-CM | POA: Insufficient documentation

## 2015-08-13 DIAGNOSIS — F1010 Alcohol abuse, uncomplicated: Secondary | ICD-10-CM | POA: Insufficient documentation

## 2015-08-13 DIAGNOSIS — Z9119 Patient's noncompliance with other medical treatment and regimen: Secondary | ICD-10-CM | POA: Insufficient documentation

## 2015-08-13 DIAGNOSIS — Z7289 Other problems related to lifestyle: Secondary | ICD-10-CM

## 2015-08-13 LAB — URINE MICROSCOPIC-ADD ON

## 2015-08-13 LAB — PROTIME-INR
INR: 1.08 (ref 0.00–1.49)
PROTHROMBIN TIME: 14.2 s (ref 11.6–15.2)

## 2015-08-13 LAB — CBC
HCT: 43.6 % (ref 39.0–52.0)
Hemoglobin: 14.8 g/dL (ref 13.0–17.0)
MCH: 29.7 pg (ref 26.0–34.0)
MCHC: 33.9 g/dL (ref 30.0–36.0)
MCV: 87.4 fL (ref 78.0–100.0)
Platelets: 244 10*3/uL (ref 150–400)
RBC: 4.99 MIL/uL (ref 4.22–5.81)
RDW: 14 % (ref 11.5–15.5)
WBC: 6.8 10*3/uL (ref 4.0–10.5)

## 2015-08-13 LAB — BASIC METABOLIC PANEL
Anion gap: 10 (ref 5–15)
BUN: 14 mg/dL (ref 6–20)
CALCIUM: 9.4 mg/dL (ref 8.9–10.3)
CHLORIDE: 107 mmol/L (ref 101–111)
CO2: 26 mmol/L (ref 22–32)
Creatinine, Ser: 1.08 mg/dL (ref 0.61–1.24)
GFR calc non Af Amer: >60 mL/min (ref >60)
Glucose, Bld: 122 mg/dL — ABNORMAL HIGH (ref 65–99)
POTASSIUM: 3.9 mmol/L (ref 3.5–5.1)
Sodium: 143 mmol/L (ref 135–145)

## 2015-08-13 LAB — I-STAT TROPONIN, ED: TROPONIN I, POC: 0.01 ng/mL (ref 0.00–0.08)

## 2015-08-13 LAB — URINALYSIS, ROUTINE W REFLEX MICROSCOPIC
Glucose, UA: NEGATIVE mg/dL
Hgb urine dipstick: NEGATIVE
Ketones, ur: NEGATIVE mg/dL
Nitrite: NEGATIVE
Protein, ur: NEGATIVE mg/dL
SPECIFIC GRAVITY, URINE: 1.028 (ref 1.005–1.030)
pH: 5 (ref 5.0–8.0)

## 2015-08-13 LAB — RAPID URINE DRUG SCREEN, HOSP PERFORMED
Amphetamines: NOT DETECTED
BARBITURATES: NOT DETECTED
Benzodiazepines: NOT DETECTED
COCAINE: NOT DETECTED
Opiates: NOT DETECTED
Tetrahydrocannabinol: NOT DETECTED

## 2015-08-13 LAB — APTT: aPTT: 27 seconds (ref 24–37)

## 2015-08-13 LAB — ETHANOL: Alcohol, Ethyl (B): <5 mg/dL (ref <5)

## 2015-08-13 MED ORDER — SODIUM CHLORIDE 0.9% FLUSH: 3.0000 mL | INTRAVENOUS | Status: DC | PRN

## 2015-08-13 MED ORDER — SODIUM CHLORIDE 0.9% FLUSH
3.0000 mL | Freq: Two times a day (BID) | INTRAVENOUS | Status: DC
  Administered 2015-08-14: 3 mL via INTRAVENOUS

## 2015-08-13 MED ORDER — ADULT MULTIVITAMIN W/MINERALS CH
1.0000 | ORAL_TABLET | Freq: Every day | ORAL | Status: DC
  Administered 2015-08-13 – 2015-08-14 (×2): 1 via ORAL
  Filled 2015-08-13 (×4): qty 1

## 2015-08-13 MED ORDER — ASPIRIN 325 MG PO TABS
325.0000 mg | ORAL_TABLET | Freq: Every day | ORAL | Status: DC
  Administered 2015-08-13 – 2015-08-14 (×2): 325 mg via ORAL
  Filled 2015-08-13 (×2): qty 1

## 2015-08-13 MED ORDER — LISINOPRIL 20 MG PO TABS
20.0000 mg | ORAL_TABLET | Freq: Once | ORAL | Status: AC
  Administered 2015-08-13: 20 mg via ORAL
  Filled 2015-08-13: qty 1

## 2015-08-13 MED ORDER — VITAMIN B-1 100 MG PO TABS
100.0000 mg | ORAL_TABLET | Freq: Every day | ORAL | Status: DC
  Administered 2015-08-13 – 2015-08-14 (×2): 100 mg via ORAL
  Filled 2015-08-13 (×3): qty 1

## 2015-08-13 MED ORDER — LORAZEPAM 2 MG/ML IJ SOLN: 1.0000 mg | Freq: Four times a day (QID) | INTRAMUSCULAR | Status: DC | PRN

## 2015-08-13 MED ORDER — SODIUM CHLORIDE 0.9 % IV SOLN: 250.0000 mL | INTRAVENOUS | Status: DC | PRN

## 2015-08-13 MED ORDER — LABETALOL HCL 5 MG/ML IV SOLN
10.0000 mg | Freq: Once | INTRAVENOUS | Status: AC
  Administered 2015-08-13: 10 mg via INTRAVENOUS
  Filled 2015-08-13: qty 4

## 2015-08-13 MED ORDER — HYDRALAZINE HCL 25 MG PO TABS
25.0000 mg | ORAL_TABLET | Freq: Three times a day (TID) | ORAL | Status: DC
  Administered 2015-08-13 – 2015-08-14 (×3): 25 mg via ORAL
  Filled 2015-08-13 (×3): qty 1

## 2015-08-13 MED ORDER — HYDRALAZINE HCL 20 MG/ML IJ SOLN: 10.0000 mg | Freq: Four times a day (QID) | INTRAMUSCULAR | Status: DC | PRN

## 2015-08-13 MED ORDER — ENOXAPARIN SODIUM 40 MG/0.4ML ~~LOC~~ SOLN
40.0000 mg | SUBCUTANEOUS | Status: DC
  Administered 2015-08-13: 40 mg via SUBCUTANEOUS
  Filled 2015-08-13: qty 0.4

## 2015-08-13 MED ORDER — LISINOPRIL 20 MG PO TABS
20.0000 mg | ORAL_TABLET | Freq: Every day | ORAL | Status: DC
  Administered 2015-08-14: 20 mg via ORAL
  Filled 2015-08-13: qty 1

## 2015-08-13 MED ORDER — LORAZEPAM 1 MG PO TABS: 1.0000 mg | ORAL_TABLET | Freq: Four times a day (QID) | ORAL | Status: DC | PRN

## 2015-08-13 MED ORDER — THIAMINE HCL 100 MG/ML IJ SOLN
100.0000 mg | Freq: Every day | INTRAMUSCULAR | Status: DC
  Filled 2015-08-13: qty 2

## 2015-08-13 MED ORDER — SODIUM CHLORIDE 0.9% FLUSH
3.0000 mL | Freq: Two times a day (BID) | INTRAVENOUS | Status: DC
  Administered 2015-08-13 – 2015-08-14 (×2): 3 mL via INTRAVENOUS

## 2015-08-13 MED ORDER — HYDROCHLOROTHIAZIDE 25 MG PO TABS
25.0000 mg | ORAL_TABLET | Freq: Every day | ORAL | Status: DC
  Administered 2015-08-13 – 2015-08-14 (×2): 25 mg via ORAL
  Filled 2015-08-13 (×2): qty 1

## 2015-08-13 MED ORDER — FOLIC ACID 1 MG PO TABS
1.0000 mg | ORAL_TABLET | Freq: Every day | ORAL | Status: DC
  Administered 2015-08-13 – 2015-08-14 (×2): 1 mg via ORAL
  Filled 2015-08-13 (×3): qty 1

## 2015-08-13 NOTE — ED Notes (Signed)
Pt reports yesterday he was outside smoking a cigarette and started feeling like the room was spinning. States he has HTN and has been off his medication for a year. Also reports a headache.

## 2015-08-13 NOTE — H&P (Signed)
[facility] History and Physical  Glenn Newton [id number] DOB: [birth year] DOA: 08/13/2015  Referring physician: Jeneen Rinks PCP: No PCP Per Patient   Chief Complaint: dizziness  HPI: Glenn Newton is a 45 y.o. male  With PMHX significant for HTN, CVA, HLD and heavy binge alcohol use.  Comes in today with 2 days history of dizziness.  He was outside smoking when he felt that the area was spinning.  He also had a headache.  The spinning kept him from sleeping last PM.   He has not taken his BP meds in 1 years.  He has a cuff at home and his girlfriend checks it so often but he does not know the number but suspects it is high.  No chest pain, no SOB  Dizziness now resolved in the ER. No headache.   BP improved slightly with PRN IV labetalol.  Labs remarkably normal.  Will admit for better BP control and TIA work up    Review of Systems:  All systems reviewed, negative unless stated above   Past Medical History  Diagnosis Date  . Hypertension   . Left ventricular hypertrophy   . Hypertension   . High cholesterol   . TIA (transient ischemic attack) 02/06/2014  . Stroke [facility]) 04/2008    left thalamus/posterior limb internal capsule and right parietal subcortical [place] matter.   Past Surgical History  Procedure Laterality Date  . No past surgeries     Social History:  reports that he has been smoking Cigarettes.  He has a 46 pack-year smoking history. He has never used smokeless tobacco. He reports that he drinks about 5.4 oz of alcohol per week. He reports that he uses illicit drugs (Cocaine).-- former- 2015  No Known Allergies  Family History  Problem Relation Age of Onset  . Stroke       first-degree relative less than 60  HTN- both Mom and DAD  Prior to Admission medications   Medication Sig Start Date End Date Taking? Authorizing Provider  aspirin 325 MG tablet Take 325 mg by mouth daily.   Yes Historical Provider, MD  atorvastatin (LIPITOR) 40 MG tablet Take 1  tablet (40 mg total) by mouth daily at 6 PM. Patient not taking: Reported on 08/13/2015 02/08/14   Sindy Guadeloupe Rivet, MD  hydrochlorothiazide (HYDRODIURIL) 25 MG tablet Take 1 tablet (25 mg total) by mouth daily. Patient not taking: Reported on 08/13/2015 01/04/14   Jerrye Noble, MD  lisinopril (PRINIVIL,ZESTRIL) 10 MG tablet Take 1 tablet (10 mg total) by mouth daily. Patient not taking: Reported on 08/13/2015 01/04/14   Jerrye Noble, MD   Physical Exam: Filed Vitals:   08/13/15 1700 08/13/15 1702 08/13/15 1715 08/13/15 1802  BP: 192/115  194/123 198/123  Pulse: 64  62   Temp:  98.1 F (36.7 C)    TempSrc:      Resp:      Height:      Weight:      SpO2: 99%  100%     Wt Readings from Last 3 Encounters:  08/13/15 78.019 kg (172 lb)  02/08/14 78.198 kg (172 lb 6.3 oz)  01/04/14 79.924 kg (176 lb 3.2 oz)    General:  Appears calm and comfortable Eyes: PERRL, normal lids, irises & conjunctiva ENT: grossly normal hearing, lips & tongue Neck: no LAD, masses or thyromegaly Cardiovascular: RRR, no m/r/g. No LE edema. Telemetry: SR, no arrhythmias  Respiratory: CTA bilaterally, no w/r/r. Normal respiratory effort. Abdomen: soft,  ntnd Skin: no rash or induration seen on limited exam Musculoskeletal: grossly normal tone BUE/BLE Psychiatric: grossly normal mood and affect, speech fluent and appropriate Neurologic: grossly non-focal.          Labs on Admission:  Basic Metabolic Panel:  Recent Labs Lab 08/13/15 1416  NA 143  K 3.9  CL 107  CO2 26  GLUCOSE 122*  BUN 14  CREATININE 1.08  CALCIUM 9.4   Liver Function Tests: No results for input(s): AST, ALT, ALKPHOS, BILITOT, PROT, ALBUMIN in the last 168 hours. No results for input(s): LIPASE, AMYLASE in the last 168 hours. No results for input(s): AMMONIA in the last 168 hours. CBC:  Recent Labs Lab 08/13/15 1416  WBC 6.8  HGB 14.8  HCT 43.6  MCV 87.4  PLT 244   Cardiac Enzymes: No results for input(s): CKTOTAL, CKMB,  CKMBINDEX, TROPONINI in the last 168 hours.  BNP (last 3 results) No results for input(s): BNP in the last 8760 hours.  ProBNP (last 3 results) No results for input(s): PROBNP in the last 8760 hours.  CBG: No results for input(s): GLUCAP in the last 168 hours.  Radiological Exams on Admission: Dg Chest 2 View  08/13/2015  CLINICAL DATA:  Dizziness since last evening. EXAM: CHEST  2 VIEW COMPARISON:  03/28/2014. FINDINGS: The heart size and mediastinal contours are within normal limits. Both lungs are clear. The visualized skeletal structures are unremarkable. IMPRESSION: Normal chest x-ray. Electronically Signed   By: Marijo Sanes M.D.   On: 08/13/2015 16:12   Ct Head Wo Contrast  08/13/2015  CLINICAL DATA:  Patient with dizziness and headache. EXAM: CT HEAD WITHOUT CONTRAST TECHNIQUE: Contiguous axial images were obtained from the base of the skull through the vertex without intravenous contrast. COMPARISON:  MRI brain 02/07/2014 FINDINGS: Old infarcts within the right and left thalamus. Chronic right basal ganglia lacunar infarct. Periventricular and subcortical [place] matter hypodensity compatible with chronic microvascular ischemic changes. No evidence for acute cortically based infarct, intracranial hemorrhage, mass lesion or mass-effect. Orbits are unremarkable. Paranasal sinuses are well aerated. Mastoid air cells unremarkable. Calvarium is intact. IMPRESSION: No acute intracranial process. Chronic microvascular ischemic changes. Electronically Signed   By: Lovey Newcomer M.D.   On: 08/13/2015 16:56    EKG: Independently reviewed. Sinus with LVH-- similar to previous  Assessment/Plan Principal Problem:   Hypertensive urgency Active Problems:   TOBACCO ABUSE   TIA (transient ischemic attack)   Alcohol use (HCC)   Dizziness- suspect due to HTN urgency-- has not taken BP meds in 1 year. Will r/o TIA/CVA -resolved  Alcohol use -heavy use this past weekend -CIWA  H/o CVA but  doubt TIA as symptoms resolved with lowering BP MRI -echo  Carotid -add ASA low dose -check FLP  HLD -check FLP   Code Status: full DVT Prophylaxis: Family Communication: patient Disposition Plan: obs for BP control  Time spent: 65 min  [facility] Pager [phone]

## 2015-08-13 NOTE — ED Provider Notes (Signed)
Care assumed from Dr. Alvino Chapel. Patient examined.  CT scan shows old right, and left thalamic infarcts. Also her right basal ganglia lacunar infarct.  She states he had strokes 2 years ago, and one year ago. 2 years ago he had numbness and weakness of his right side including face, arm, and leg. A year ago he had symptoms similar today with vertigo like symptoms.  His been out of his blood pressure medicine for greater than one year. He is hypertensive here. Originally 216/132. Given labetalol and improves, 192/115. However now recheck is 207/125. We'll re-dose labetalol. Will require admission for blood pressure control. MRI requested and pending.  Tanna Furry, MD 08/13/15 [phone]

## 2015-08-13 NOTE — ED Notes (Signed)
Patient transported to MRI 

## 2015-08-13 NOTE — ED Notes (Signed)
Attempted report x 1. RN to call back. 

## 2015-08-13 NOTE — ED Provider Notes (Signed)
CSN: [id number]     Arrival date & time 08/13/15  1357 History   First MD Initiated Contact with Patient 08/13/15 1519     Chief Complaint  Patient presents with  . Dizziness      Patient is a 45 y.o. male presenting with dizziness. The history is provided by the patient.  Dizziness Quality:  Head spinning Associated symptoms: no chest pain, no diarrhea, no headaches, no nausea, no shortness of breath, no vomiting and no weakness    patient presents with dizziness. Began last night around 8 PM. States he feels a little off today.  States he feels if he could fall to the right. No chest pain or trouble breathing. He has a history of hypertension and has been off his medications for about a year. History of TIA and also has had dizziness with the TIA in the past. Also previous history of stroke. Denies substance abuse. Denies visual changes. States he feels as if the room was spinning around. States is worse when he looks up. He does smoke cigarettes but denies other drug use.  Past Medical History  Diagnosis Date  . Hypertension   . Left ventricular hypertrophy   . Hypertension   . High cholesterol   . TIA (transient ischemic attack) 02/06/2014  . Stroke [facility]) 04/2008    left thalamus/posterior limb internal capsule and right parietal subcortical Enslow matter.   Past Surgical History  Procedure Laterality Date  . No past surgeries     Family History  Problem Relation Age of Onset  . Stroke       first-degree relative less than 60   Social History  Substance Use Topics  . Smoking status: Current Every Day Smoker -- 2.00 packs/day for 23 years    Types: Cigarettes    Last Attempt to Quit: 01/14/2014  . Smokeless tobacco: Never Used  . Alcohol Use: 5.4 oz/week    9 Cans of beer per week    Review of Systems  Constitutional: Negative for activity change and appetite change.  Eyes: Negative for pain.  Respiratory: Negative for chest tightness and shortness of breath.    Cardiovascular: Negative for chest pain and leg swelling.  Gastrointestinal: Negative for nausea, vomiting, abdominal pain and diarrhea.  Genitourinary: Negative for flank pain.  Musculoskeletal: Negative for back pain and neck stiffness.  Skin: Negative for rash.  Neurological: Positive for dizziness. Negative for weakness, numbness and headaches.  Psychiatric/Behavioral: Negative for behavioral problems.      Allergies  Review of patient's allergies indicates no known allergies.  Home Medications   Prior to Admission medications   Medication Sig Start Date End Date Taking? Authorizing Provider  atorvastatin (LIPITOR) 40 MG tablet Take 1 tablet (40 mg total) by mouth daily at 6 PM. 02/08/14   Carly J Rivet, MD  hydrochlorothiazide (HYDRODIURIL) 25 MG tablet Take 1 tablet (25 mg total) by mouth daily. 01/04/14   Jerrye Noble, MD  lisinopril (PRINIVIL,ZESTRIL) 10 MG tablet Take 1 tablet (10 mg total) by mouth daily. 01/04/14   Jerrye Noble, MD   BP 216/132 mmHg  Pulse 106  Temp(Src) 97.8 F (36.6 C) (Oral)  Resp 18  Ht 5\' 5"  (1.651 m)  Wt 172 lb (78.019 kg)  BMI 28.62 kg/m2  SpO2 100% Physical Exam  Constitutional: He is oriented to person, place, and time. He appears well-developed and well-nourished.  HENT:  Head: Normocephalic and atraumatic.  Eyes: Pupils are equal, round, and reactive to light.  Neck: Normal range of motion. Neck supple.  Cardiovascular: Regular rhythm and normal heart sounds.   No murmur heard.  Mild tachycardia  Pulmonary/Chest: Effort normal and breath sounds normal.  Abdominal: Soft. Bowel sounds are normal. He exhibits no distension. There is no tenderness.  Musculoskeletal: Normal range of motion. He exhibits no edema.  Neurological: He is alert and oriented to person, place, and time. No cranial nerve deficit.   Finger-nose intact bilaterally. Extraocular was intact but does have slight nystagmus with gaze to the lateral aspect of the side.  Symmetric smile. Good grip strength bilaterally. No Romberg.  Skin: Skin is warm and dry.  Psychiatric: He has a normal mood and affect.  Nursing note and vitals reviewed.   ED Course  Procedures (including critical care time) Labs Review Labs Reviewed  BASIC METABOLIC PANEL - Abnormal; Notable for the following:    Glucose, Bld 122 (*)    All other components within normal limits  CBC  URINALYSIS, ROUTINE W REFLEX MICROSCOPIC (NOT AT [facility])  URINE RAPID DRUG SCREEN, HOSP PERFORMED  ETHANOL  PROTIME-INR  APTT  I-STAT TROPOININ, ED    Imaging Review Dg Chest 2 View  08/13/2015  CLINICAL DATA:  Dizziness since last evening. EXAM: CHEST  2 VIEW COMPARISON:  03/28/2014. FINDINGS: The heart size and mediastinal contours are within normal limits. Both lungs are clear. The visualized skeletal structures are unremarkable. IMPRESSION: Normal chest x-ray. Electronically Signed   By: Marijo Sanes M.D.   On: 08/13/2015 16:12   I have personally reviewed and evaluated these images and lab results as part of my medical decision-making.   EKG Interpretation   Date/Time:  Tuesday August 13 2015 14:06:55 EST Ventricular Rate:  103 PR Interval:  142 QRS Duration: 92 QT Interval:  344 QTC Calculation: 450 R Axis:   133 Text Interpretation:   Suspect arm lead reversal, interpretation  assumes no reversal Sinus tachycardia Confirmed by Alvino Chapel  MD, Corabelle Spackman  [phone]) on 08/13/2015 3:20:21 PM    EKG Interpretation  Date/Time:  Tuesday August 13 2015 15:26:34 EST Ventricular Rate:  74 PR Interval:  154 QRS Duration: 93 QT Interval:  382 QTC Calculation: 424 R Axis:   64 Text Interpretation:  Sinus rhythm Probable anteroseptal infarct, old Repol abnrm suggests ischemia, inferior leads Confirmed by Alvino Chapel  MD, Ovid Curd [phone]) on 08/13/2015 4:12:59 PM           MDM   Final diagnoses:  Essential hypertension  Transient cerebral ischemia, unspecified transient cerebral ischemia  type  Vertigo     patient with dizziness. Has had history of TIAs with similar symptoms. He is severely hypertensive as been off his medication. Will need admission for hypertension and TIA rule out. Not a TPA candidate due to somewhat the improvement of symptoms and also because the onset was around 8 PM last night. Care turned over to Dr. Jeneen Rinks.    Davonna Belling, MD 08/13/15 [phone]

## 2015-08-14 ENCOUNTER — Ambulatory Visit (HOSPITAL_BASED_OUTPATIENT_CLINIC_OR_DEPARTMENT_OTHER): Payer: BLUE CROSS/BLUE SHIELD

## 2015-08-14 DIAGNOSIS — G459 Transient cerebral ischemic attack, unspecified: Secondary | ICD-10-CM

## 2015-08-14 DIAGNOSIS — I160 Hypertensive urgency: Secondary | ICD-10-CM | POA: Diagnosis not present

## 2015-08-14 DIAGNOSIS — Z789 Other specified health status: Secondary | ICD-10-CM

## 2015-08-14 LAB — CBC
HEMATOCRIT: 41.5 % (ref 39.0–52.0)
Hemoglobin: 14 g/dL (ref 13.0–17.0)
MCH: 29.4 pg (ref 26.0–34.0)
MCHC: 33.7 g/dL (ref 30.0–36.0)
MCV: 87.2 fL (ref 78.0–100.0)
PLATELETS: 228 10*3/uL (ref 150–400)
RBC: 4.76 MIL/uL (ref 4.22–5.81)
RDW: 14.1 % (ref 11.5–15.5)
WBC: 4 10*3/uL (ref 4.0–10.5)

## 2015-08-14 LAB — BASIC METABOLIC PANEL
Anion gap: 9 (ref 5–15)
BUN: 12 mg/dL (ref 6–20)
CALCIUM: 9.1 mg/dL (ref 8.9–10.3)
CO2: 23 mmol/L (ref 22–32)
Chloride: 105 mmol/L (ref 101–111)
Creatinine, Ser: 1.17 mg/dL (ref 0.61–1.24)
GFR calc Af Amer: >60 mL/min (ref >60)
Glucose, Bld: 154 mg/dL — ABNORMAL HIGH (ref 65–99)
POTASSIUM: 3.6 mmol/L (ref 3.5–5.1)
SODIUM: 137 mmol/L (ref 135–145)

## 2015-08-14 LAB — LIPID PANEL
CHOLESTEROL: 190 mg/dL (ref 0–200)
HDL: 58 mg/dL (ref >40)
LDL CALC: 104 mg/dL — AB (ref 0–99)
TRIGLYCERIDES: 138 mg/dL (ref <150)
Total CHOL/HDL Ratio: 3.3 RATIO
VLDL: 28 mg/dL (ref 0–40)

## 2015-08-14 MED ORDER — ATORVASTATIN CALCIUM 40 MG PO TABS
40.0000 mg | ORAL_TABLET | Freq: Every day | ORAL | Status: DC
Start: 2015-08-14 — End: 2019-11-30

## 2015-08-14 MED ORDER — ASPIRIN 325 MG PO TABS: 325.0000 mg | ORAL_TABLET | Freq: Every day | ORAL | Status: AC

## 2015-08-14 MED ORDER — HYDROCHLOROTHIAZIDE 25 MG PO TABS: 25.0000 mg | ORAL_TABLET | Freq: Every day | ORAL | Status: DC

## 2015-08-14 MED ORDER — LISINOPRIL 10 MG PO TABS: 10.0000 mg | ORAL_TABLET | Freq: Every day | ORAL | Status: DC

## 2015-08-14 NOTE — Care Management Note (Signed)
Case Management Note  Patient Details  Name: RAAHIL BUB MRN: [id number] Date of Birth: [birth year]  Subjective/Objective:                    Action/Plan: Plan is for patient to discharge home today with self care. No PCP per patient. CM provided him with the [facility] number and encouraged him to use ASAP to obtain a PCP. Patient agrees to follow up with obtaining a PCP. Bedside RN updated.   Expected Discharge Date:  08/15/15               Expected Discharge Plan:     In-House Referral:     Discharge planning Services     Post Acute Care Choice:    Choice offered to:     DME Arranged:    DME Agency:     HH Arranged:    HH Agency:     Status of Service:  In process, will continue to follow  Medicare Important Message Given:    Date Medicare IM Given:    Medicare IM give by:    Date Additional Medicare IM Given:    Additional Medicare Important Message give by:     If discussed at [place] of Stay Meetings, dates discussed:    Additional Comments:  Pollie Friar, RN 08/14/2015, 2:15 PM

## 2015-08-14 NOTE — Care Management Note (Signed)
Case Management Note  Patient Details  Name: Glenn Newton MRN: [id number] Date of Birth: [birth year]  Subjective/Objective:                    Action/Plan: Patient was admitted with dizziness, hypertensive urgency.  Will follow for discharge needs pending PT/OT evals and physician orders.  Expected Discharge Date:  08/15/15               Expected Discharge Plan:     In-House Referral:     Discharge planning Services     Post Acute Care Choice:    Choice offered to:     DME Arranged:    DME Agency:     HH Arranged:    HH Agency:     Status of Service:  In process, will continue to follow  Medicare Important Message Given:    Date Medicare IM Given:    Medicare IM give by:    Date Additional Medicare IM Given:    Additional Medicare Important Message give by:     If discussed at [place] of Stay Meetings, dates discussed:    Additional Comments:  Rolm Baptise, RN 08/14/2015, 11:35 AM [phone]

## 2015-08-14 NOTE — Progress Notes (Signed)
Patient being discharged home discharge summary reviewed with medications, he is alert and oriented no complaints of pain IV removed. Case manager discussed with patient issue with no PCP and patient agreed to use resources she provided to obtain one ASAP. Will discus IMMI program with patient and transport out of hospital with wheel chair.

## 2015-08-14 NOTE — Discharge Summary (Signed)
PATIENT DETAILS Name: Glenn Newton Age: 45 y.o. Sex: male Date of Birth: March 16, 1971 MRN: [id number]. Admitting Physician: Geradine Girt, DO PCP:No PCP Per Patient  Admit Date: 08/13/2015 Discharge date: 08/14/2015  Recommendations for Outpatient Follow-up:  1. Counse regarding importance of compliance to medications.  2. Recheck electrolytes in the next few weeks.  PRIMARY DISCHARGE DIAGNOSIS:  Principal Problem:   Hypertensive urgency Active Problems:   TOBACCO ABUSE   TIA (transient ischemic attack)   Alcohol use (HCC)      PAST MEDICAL HISTORY: Past Medical History  Diagnosis Date  . Hypertension   . Left ventricular hypertrophy   . Hypertension   . High cholesterol   . TIA (transient ischemic attack) 02/06/2014  . Stroke [facility]) 04/2008    left thalamus/posterior limb internal capsule and right parietal subcortical [place] matter.    DISCHARGE MEDICATIONS: Current Discharge Medication List    CONTINUE these medications which have CHANGED   Details  aspirin 325 MG tablet Take 1 tablet (325 mg total) by mouth daily. Qty: 120 tablet, Refills: 0    atorvastatin (LIPITOR) 40 MG tablet Take 1 tablet (40 mg total) by mouth daily at 6 PM. Qty: 120 tablet, Refills: 0    hydrochlorothiazide (HYDRODIURIL) 25 MG tablet Take 1 tablet (25 mg total) by mouth daily. Qty: 120 tablet, Refills: 0    lisinopril (PRINIVIL,ZESTRIL) 10 MG tablet Take 1 tablet (10 mg total) by mouth daily. Qty: 120 tablet, Refills: 0        ALLERGIES:  No Known Allergies  BRIEF HPI:  See H&P, Labs, Consult and Test reports for all details in brief, patient is a 45 year old male with history of hypertension, dyslipidemia, CVA-noncompliance with medication presented with dizziness.  CONSULTATIONS:   None  PERTINENT RADIOLOGIC STUDIES: Dg Chest 2 View  08/13/2015  CLINICAL DATA:  Dizziness since last evening. EXAM: CHEST  2 VIEW COMPARISON:  03/28/2014. FINDINGS: The heart size and mediastinal  contours are within normal limits. Both lungs are clear. The visualized skeletal structures are unremarkable. IMPRESSION: Normal chest x-ray. Electronically Signed   By: Marijo Sanes M.D.   On: 08/13/2015 16:12   Ct Head Wo Contrast  08/13/2015  CLINICAL DATA:  Patient with dizziness and headache. EXAM: CT HEAD WITHOUT CONTRAST TECHNIQUE: Contiguous axial images were obtained from the base of the skull through the vertex without intravenous contrast. COMPARISON:  MRI brain 02/07/2014 FINDINGS: Old infarcts within the right and left thalamus. Chronic right basal ganglia lacunar infarct. Periventricular and subcortical Bowens matter hypodensity compatible with chronic microvascular ischemic changes. No evidence for acute cortically based infarct, intracranial hemorrhage, mass lesion or mass-effect. Orbits are unremarkable. Paranasal sinuses are well aerated. Mastoid air cells unremarkable. Calvarium is intact. IMPRESSION: No acute intracranial process. Chronic microvascular ischemic changes. Electronically Signed   By: Lovey Newcomer M.D.   On: 08/13/2015 16:56   Mr Brain Wo Contrast  08/13/2015  CLINICAL DATA:  45 year old hypertensive male with high cholesterol presenting with dizziness. Subsequent encounter. EXAM: MRI HEAD WITHOUT CONTRAST TECHNIQUE: Multiplanar, multiecho pulse sequences of the brain and surrounding structures were obtained without intravenous contrast. COMPARISON:  08/13/2015 CT.  02/07/2014 MR. FINDINGS: No acute infarct or intracranial hemorrhage. Several remote small infarcts within the thalamus bilaterally, basal ganglia, right paracentral pons and centrum semiovale bilaterally. Prominent Hewlett matter changes which in the present clinical setting are most consistent with result of chronic microvascular ischemia. With involvement of the corpus callosum, one could raise the possibility of demyelinating process  but this is felt to be a less likely consideration. Vasculitis or inflammatory  process could cause a similar appearance. Findings have progressed slightly since prior exam. Scattered blood breakdown products consistent prior episodes hemorrhagic ischemia. No hydrocephalus. No intracranial mass lesion noted on this unenhanced exam. Major intracranial vascular structures are patent. Partially empty sella unchanged without secondary findings of pseudotumor cerebri. Mild degenerative changes C1-2. Cervical medullary junction unremarkable. IMPRESSION: No acute infarct or intracranial hemorrhage. Several remote small infarcts within the thalamus bilaterally, basal ganglia, right paracentral pons and centrum semiovale bilaterally. The right paracentral pontine infarct although remote is new from the prior MR. Prominent [place] matter changes which in the present clinical setting are most consistent with result of chronic microvascular ischemia have progressed slightly since the prior exam. Other considerations of Schwegel matter changes felt to be less likely as noted above. Scattered blood breakdown products consistent prior episodes hemorrhagic ischemia. Partially empty sella unchanged without secondary findings of pseudotumor cerebri. Electronically Signed   By: Genia Del M.D.   On: 08/13/2015 19:35     PERTINENT LAB RESULTS: CBC:  Recent Labs  08/13/15 1416 08/14/15 0828  WBC 6.8 4.0  HGB 14.8 14.0  HCT 43.6 41.5  PLT 244 228   CMET CMP     Component Value Date/Time   NA 137 08/14/2015 0828   K 3.6 08/14/2015 0828   CL 105 08/14/2015 0828   CO2 23 08/14/2015 0828   GLUCOSE 154* 08/14/2015 0828   BUN 12 08/14/2015 0828   CREATININE 1.17 08/14/2015 0828   CREATININE 1.16 12/21/2013 0911   CALCIUM 9.1 08/14/2015 0828   PROT 7.3 03/28/2014 0045   ALBUMIN 3.6 03/28/2014 0045   AST 24 03/28/2014 0045   ALT 14 03/28/2014 0045   ALKPHOS 86 03/28/2014 0045   BILITOT 0.3 03/28/2014 0045   GFRNONAA >60 08/14/2015 0828   GFRAA >60 08/14/2015 0828    GFR Estimated  Creatinine Clearance: 79.1 mL/min (by C-G formula based on Cr of 1.17). No results for input(s): LIPASE, AMYLASE in the last 72 hours. No results for input(s): CKTOTAL, CKMB, CKMBINDEX, TROPONINI in the last 72 hours. Invalid input(s): POCBNP No results for input(s): DDIMER in the last 72 hours. No results for input(s): HGBA1C in the last 72 hours.  Recent Labs  08/14/15 0828  CHOL 190  HDL 58  LDLCALC 104*  TRIG 138  CHOLHDL 3.3   No results for input(s): TSH, T4TOTAL, T3FREE, THYROIDAB in the last 72 hours.  Invalid input(s): FREET3 No results for input(s): VITAMINB12, FOLATE, FERRITIN, TIBC, IRON, RETICCTPCT in the last 72 hours. Coags:  Recent Labs  08/13/15 1418  INR 1.08   Microbiology: No results found for this or any previous visit (from the past 240 hour(s)).   BRIEF HOSPITAL COURSE:   Principal Problem: Dizziness: Resolved. Ambulating in the room without any assistance. Suspect secondary to uncontrolled hypertension. MRI brain negative for CVA, 2-D echocardiogram shows preserved ejection fraction without any wall motion abnormalities. Carotid duplex negative for major stenosis. Continue lisinopril and HCTZ-blood pressure much more better controlled. Continue aspirin and statins.  Active Problems: Uncontrolled hypertension: Likely causing above. Blood pressure much better controlled with initiation of lisinopril and HCTZ. Continue to optimize in the outpatient setting. Counseled extensively regarding importance of compliance.  Dyslipidemia: Continue statins. Recheck lipid panel in 3 months.  Noncompliance: Counseled extensively regarding importance of compliance to medications. Explained risk of stroke/CVA.  Alcohol abuse: Counseled. No signs of withdrawal on discharge. Completely awake and  alert.  TODAY-DAY OF DISCHARGE:  Subjective:   Glenn Newton today has no headache,no chest abdominal pain,no new weakness tingling or numbness, feels much better wants to go  home today.   Objective:   Blood pressure 154/99, pulse 65, temperature 98.1 F (36.7 C), temperature source Oral, resp. rate 16, height 5\' 5"  (1.651 m), weight 83.1 kg (183 lb 3.2 oz), SpO2 96 %.  Intake/Output Summary (Last 24 hours) at 08/14/15 1350 Last data filed at 08/14/15 0959  Gross per 24 hour  Intake    366 ml  Output    200 ml  Net    166 ml   Filed Weights   08/13/15 1403 08/13/15 2157  Weight: 78.019 kg (172 lb) 83.1 kg (183 lb 3.2 oz)    Exam Awake Alert, Oriented *3, No new F.N deficits, Normal affect [place].AT,PERRAL Supple Neck,No JVD, No cervical lymphadenopathy appriciated.  Symmetrical Chest wall movement, Good air movement bilaterally, CTAB RRR,No Gallops,Rubs or new Murmurs, No Parasternal Heave +ve B.Sounds, Abd Soft, Non tender, No organomegaly appriciated, No rebound -guarding or rigidity. No Cyanosis, Clubbing or edema, No new Rash or bruise  DISCHARGE CONDITION: Stable  DISPOSITION: Home  DISCHARGE INSTRUCTIONS:    Activity:  As tolerated   Get Medicines reviewed and adjusted: Please take all your medications with you for your next visit with your Primary MD  Please request your Primary MD to go over all hospital tests and procedure/radiological results at the follow up, please ask your Primary MD to get all Hospital records sent to his/her office.  If you experience worsening of your admission symptoms, develop shortness of breath, life threatening emergency, suicidal or homicidal thoughts you must seek medical attention immediately by calling 911 or calling your MD immediately  if symptoms less severe.  You must read complete instructions/literature along with all the possible adverse reactions/side effects for all the Medicines you take and that have been prescribed to you. Take any new Medicines after you have completely understood and accpet all the possible adverse reactions/side effects.   Do not drive when taking Pain medications.   Do  not take more than prescribed Pain, Sleep and Anxiety Medications  Special Instructions: If you have smoked or chewed Tobacco  in the last 2 yrs please stop smoking, stop any regular Alcohol  and or any Recreational drug use.  Wear Seat belts while driving.  Please note  You were cared for by a hospitalist during your hospital stay. Once you are discharged, your primary care physician will handle any further medical issues. Please note that NO REFILLS for any discharge medications will be authorized once you are discharged, as it is imperative that you return to your primary care physician (or establish a relationship with a primary care physician if you do not have one) for your aftercare needs so that they can reassess your need for medications and monitor your lab values.  Diet recommendation: Heart Healthy diet  Discharge Instructions    Call MD for:  persistant dizziness or light-headedness    Complete by:  As directed      Diet - low sodium heart healthy    Complete by:  As directed      Increase activity slowly    Complete by:  As directed            Follow-up Information    Please follow up.   Why:  Hospital follow up   Contact information:   PCP of your choice in 2  weeks.      Total Time spent on discharge equals 25 minutes.  SignedOren Binet 08/14/2015 1:50 PM

## 2015-08-14 NOTE — Progress Notes (Signed)
  Echocardiogram 2D Echocardiogram has been performed.  Glenn Newton 08/14/2015, 11:33 AM

## 2015-08-14 NOTE — Progress Notes (Signed)
*  PRELIMINARY RESULTS* Vascular Ultrasound Carotid Duplex (Doppler) has been completed. There is no obvious evidence of hemodynamically significant internal carotid artery stenosis bilaterally. Vertebral arteries are patent with antegrade flow.  08/14/2015 12:17 PM Maudry Mayhew, RVT, RDCS, RDMS

## 2016-06-24 ENCOUNTER — Emergency Department (HOSPITAL_COMMUNITY)
Admission: EM | Admit: 2016-06-24 | Discharge: 2016-06-24 | Disposition: A | Discharge status: Home / Self Care | Payer: BLUE CROSS/BLUE SHIELD | Attending: Emergency Medicine | Admitting: Emergency Medicine

## 2016-06-24 ENCOUNTER — Encounter (HOSPITAL_COMMUNITY): Payer: Self-pay | Admitting: *Deleted

## 2016-06-24 DIAGNOSIS — I10 Essential (primary) hypertension: Secondary | ICD-10-CM | POA: Insufficient documentation

## 2016-06-24 DIAGNOSIS — R519 Headache, unspecified: Secondary | ICD-10-CM

## 2016-06-24 DIAGNOSIS — F17210 Nicotine dependence, cigarettes, uncomplicated: Secondary | ICD-10-CM | POA: Insufficient documentation

## 2016-06-24 DIAGNOSIS — R51 Headache: Secondary | ICD-10-CM | POA: Diagnosis not present

## 2016-06-24 DIAGNOSIS — Z8673 Personal history of transient ischemic attack (TIA), and cerebral infarction without residual deficits: Secondary | ICD-10-CM | POA: Insufficient documentation

## 2016-06-24 DIAGNOSIS — Z7982 Long term (current) use of aspirin: Secondary | ICD-10-CM | POA: Diagnosis not present

## 2016-06-24 LAB — I-STAT CHEM 8, ED
BUN: 8 mg/dL (ref 6–20)
CHLORIDE: 105 mmol/L (ref 101–111)
Calcium, Ion: 1.18 mmol/L (ref 1.15–1.40)
Creatinine, Ser: 1 mg/dL (ref 0.61–1.24)
Glucose, Bld: 99 mg/dL (ref 65–99)
HEMATOCRIT: 42 % (ref 39.0–52.0)
HEMOGLOBIN: 14.3 g/dL (ref 13.0–17.0)
POTASSIUM: 4.3 mmol/L (ref 3.5–5.1)
SODIUM: 139 mmol/L (ref 135–145)
TCO2: 24 mmol/L (ref 0–100)

## 2016-06-24 MED ORDER — KETOROLAC TROMETHAMINE 60 MG/2ML IM SOLN
60.0000 mg | Freq: Once | INTRAMUSCULAR | Status: AC
  Administered 2016-06-24: 60 mg via INTRAMUSCULAR
  Filled 2016-06-24: qty 2

## 2016-06-24 MED ORDER — LISINOPRIL 10 MG PO TABS: 10.0000 mg | ORAL_TABLET | Freq: Every day | ORAL | 1 refills | Status: DC

## 2016-06-24 MED ORDER — HYDROCHLOROTHIAZIDE 25 MG PO TABS: 25.0000 mg | ORAL_TABLET | Freq: Every day | ORAL | 1 refills | Status: DC

## 2016-06-24 MED ORDER — LISINOPRIL 10 MG PO TABS
10.0000 mg | ORAL_TABLET | Freq: Once | ORAL | Status: AC
  Administered 2016-06-24: 10 mg via ORAL
  Filled 2016-06-24: qty 1

## 2016-06-24 NOTE — ED Provider Notes (Signed)
[facility] DEPT Provider Note   CSN: [id number] Arrival date & time: 06/24/16  0950     History   Chief Complaint Chief Complaint  Patient presents with  . Hypertension  . Headache    HPI Glenn Newton is a 45 y.o. male.  The history is provided by the patient.  Headache   This is a recurrent problem. The current episode started 1 to 2 hours ago. The problem has been resolved. The headache is associated with nothing. The pain is located in the left unilateral, right unilateral, bilateral, frontal, parietal, occipital and temporal region. The quality of the pain is described as dull. The pain is mild. He has tried nothing for the symptoms. The treatment provided no relief.    Past Medical History:  Diagnosis Date  . High cholesterol   . Hypertension   . Hypertension   . Left ventricular hypertrophy   . Stroke [facility]) 04/2008   left thalamus/posterior limb internal capsule and right parietal subcortical [place] matter.  Marland Kitchen TIA (transient ischemic attack) 02/06/2014    Patient Active Problem List   Diagnosis Date Noted  . Hypertensive urgency 08/13/2015  . Alcohol use 08/13/2015  . TIA (transient ischemic attack) 02/06/2014  . Tinea corporis 02/06/2014  . Rash and nonspecific skin eruption 01/04/2014  . Candidal intertrigo 01/04/2014  . NEVUS 10/19/2009  . HYPERLIPIDEMIA 04/30/2009  . TOBACCO ABUSE 06/20/2008  . HYPERTENSION 05/02/2008  . CEREBROVASCULAR ACCIDENT, HX OF 05/02/2008    Past Surgical History:  Procedure Laterality Date  . NO PAST SURGERIES         Home Medications    Prior to Admission medications   Medication Sig Start Date End Date Taking? Authorizing Provider  aspirin 325 MG tablet Take 1 tablet (325 mg total) by mouth daily. 08/14/15  Yes Shanker Kristeen Mans, MD  atorvastatin (LIPITOR) 40 MG tablet Take 1 tablet (40 mg total) by mouth daily at 6 PM. 08/14/15   Jonetta Osgood, MD  hydrochlorothiazide (HYDRODIURIL) 25 MG tablet Take 1 tablet (25  mg total) by mouth daily. 06/24/16   Merrily Pew, MD  lisinopril (PRINIVIL,ZESTRIL) 10 MG tablet Take 1 tablet (10 mg total) by mouth daily. 06/24/16   Merrily Pew, MD    Family History Family History  Problem Relation Age of Onset  . Stroke       first-degree relative less than 60    Social History Social History  Substance Use Topics  . Smoking status: Current Every Day Smoker    Packs/day: 2.00    Years: 23.00    Types: Cigarettes    Last attempt to quit: 01/14/2014  . Smokeless tobacco: Never Used  . Alcohol use 5.4 oz/week    9 Cans of beer per week     Allergies   Patient has no known allergies.   Review of Systems Review of Systems  Eyes: Negative for pain.  Neurological: Positive for headaches. Negative for seizures, weakness and numbness.  All other systems reviewed and are negative.    Physical Exam Updated Vital Signs BP (!) 191/127   Pulse 68   Temp 98.3 F (36.8 C) (Oral)   Resp 19   Ht 5\' 5"  (1.651 m)   Wt 195 lb (88.5 kg)   SpO2 99%   BMI 32.45 kg/m   Physical Exam  Constitutional: He is oriented to person, place, and time. He appears well-developed and well-nourished.  HENT:  Head: Normocephalic and atraumatic.  Eyes: Conjunctivae and EOM are normal.  Pupils are equal, round, and reactive to light.  Neck: Normal range of motion.  Cardiovascular: Normal rate.   Pulmonary/Chest: Effort normal. No respiratory distress.  Abdominal: Soft. He exhibits no distension.  Musculoskeletal: Normal range of motion.  Neurological: He is alert and oriented to person, place, and time.  No altered mental status, able to give full seemingly accurate history.  Face is symmetric, EOM's intact, pupils equal and reactive, vision intact, tongue and uvula midline without deviation Upper and Lower extremity motor 5/5, intact pain perception in distal extremities, 2+ reflexes in biceps, patella and achilles tendons. Finger to nose normal, heel to shin normal. Walks  without assistance or evident ataxia.   Skin: Skin is warm and dry.  Nursing note and vitals reviewed.    ED Treatments / Results  Labs (all labs ordered are listed, but only abnormal results are displayed) Labs Reviewed  I-STAT CHEM 8, ED    EKG  EKG Interpretation None       Radiology No results found.  Procedures Procedures (including critical care time)  Medications Ordered in ED Medications  lisinopril (PRINIVIL,ZESTRIL) tablet 10 mg (10 mg Oral Given 06/24/16 1403)  ketorolac (TORADOL) injection 60 mg (60 mg Intramuscular Given 06/24/16 1403)     Initial Impression / Assessment and Plan / ED Course  I have reviewed the triage vital signs and the nursing notes.  Pertinent labs & imaging results that were available during my care of the patient were reviewed by me and considered in my medical decision making (see chart for details).  Clinical Course    HA improved prior to arrial. Still HTN but no e/o end organ damage. Restarted home meds. Care management got him outpatient follow up. Plan for dc on home BP meds.    Final Clinical Impressions(s) / ED Diagnoses   Final diagnoses:  Nonintractable headache, unspecified chronicity pattern, unspecified headache type  Hypertension, unspecified type    New Prescriptions Discharge Medication List as of 06/24/2016  2:48 PM       Merrily Pew, MD 06/25/16 [phone]

## 2016-06-24 NOTE — ED Triage Notes (Signed)
Pt reports onset today of headache. Denies n/v. Pt is hypertensive at triage. Reports hx of HTN but not taking meds x 6 months.

## 2016-06-24 NOTE — ED Notes (Signed)
NAD at this time. Pt is stable and going home.  

## 2017-05-20 IMAGING — CT CT HEAD W/O CM
1 of 2 series · 16 of 30 positions shown, 20 images · non-contrast
Comparison: MRI brain 02/07/2014

CLINICAL DATA: Patient with dizziness and headache.

EXAM:
CT HEAD WITHOUT CONTRAST
TECHNIQUE: Contiguous axial images were obtained from the base of the skull
through the vertex without intravenous contrast.

[Series 3: head 2.0 h70h · axial · 0.46mm/px · z∈[-96,+48]mm · 16 of 80 slices shown, 20 images]
[im 4/80  brain]
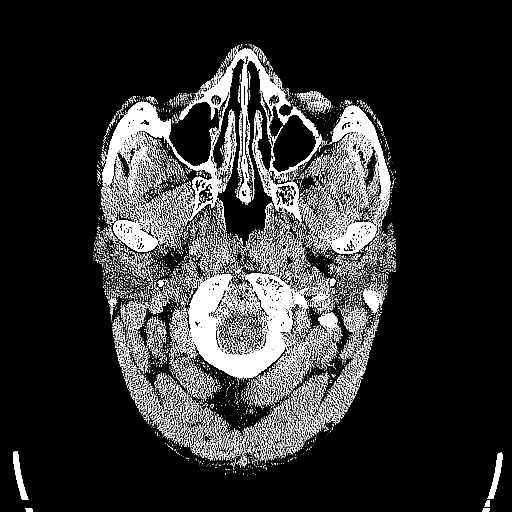
[im 4/80  bone]
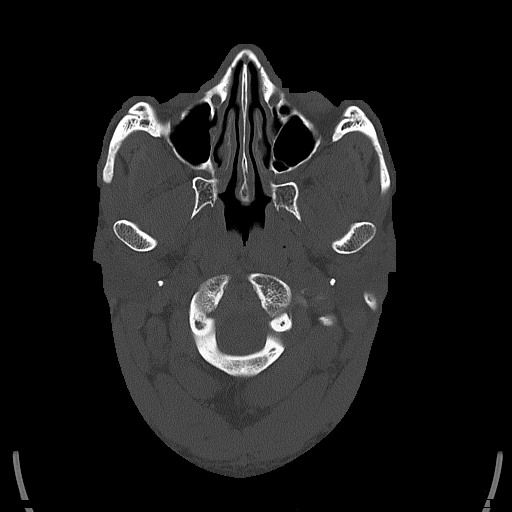
[im 8/80  brain]
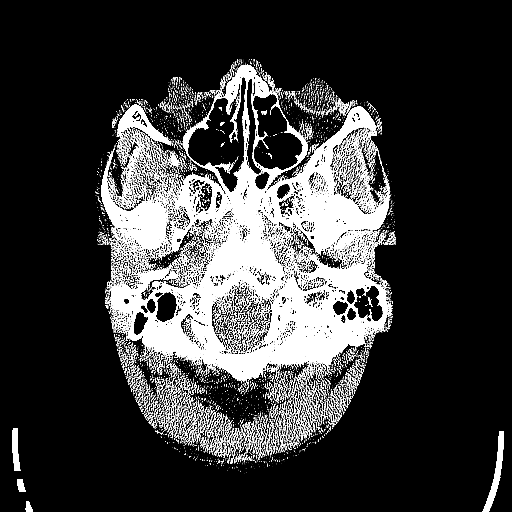
[im 12/80  brain]
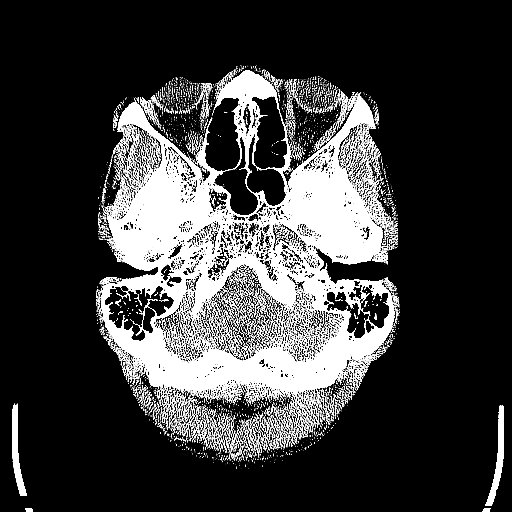
[im 20/80  brain]
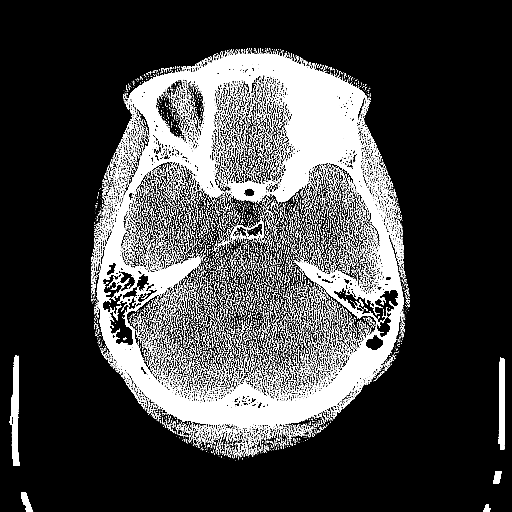
[im 24/80  brain]
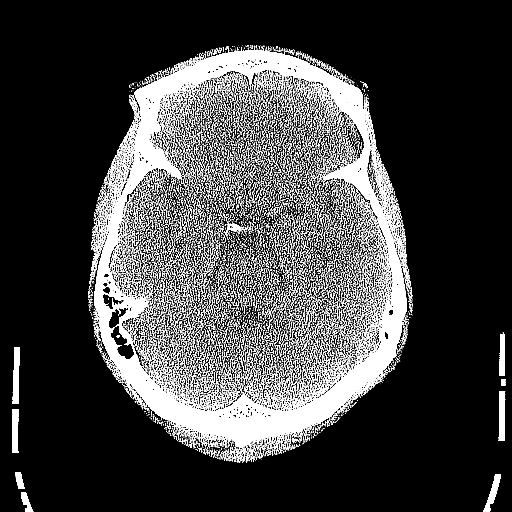
[im 24/80  bone]
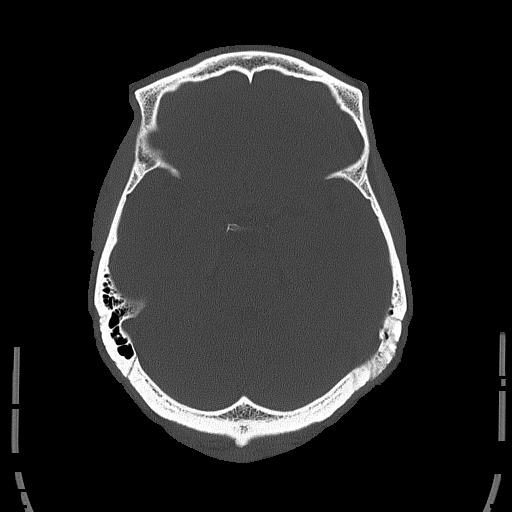
[im 28/80  brain]
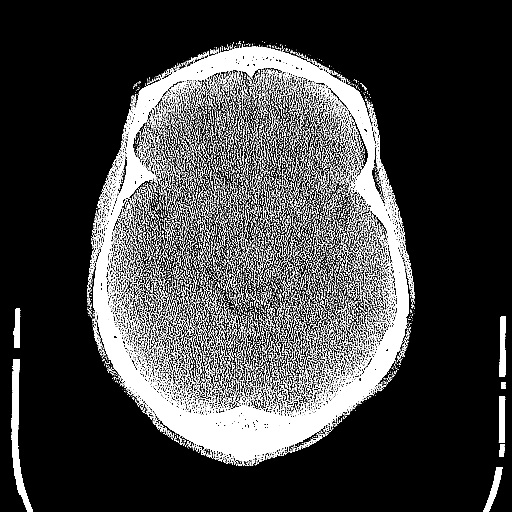
[im 32/80  brain]
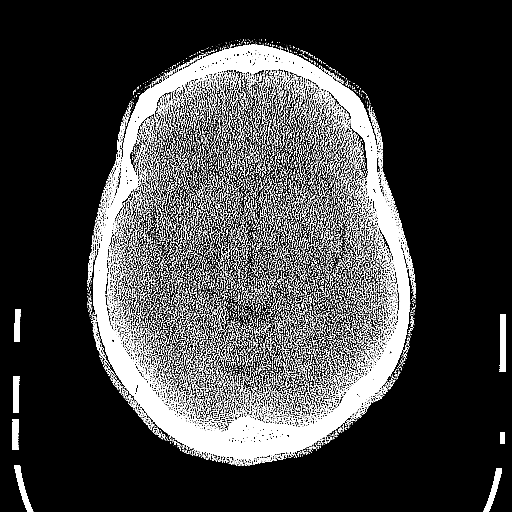
[im 36/80  brain]
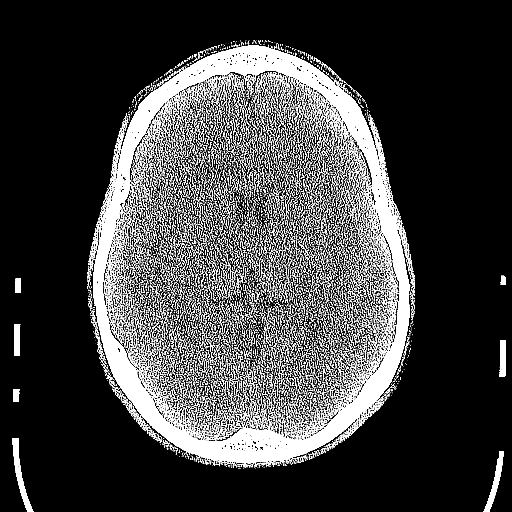
[im 44/80  brain]
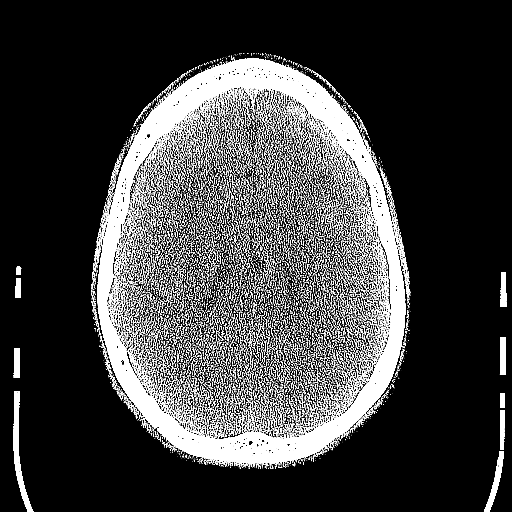
[im 44/80  bone]
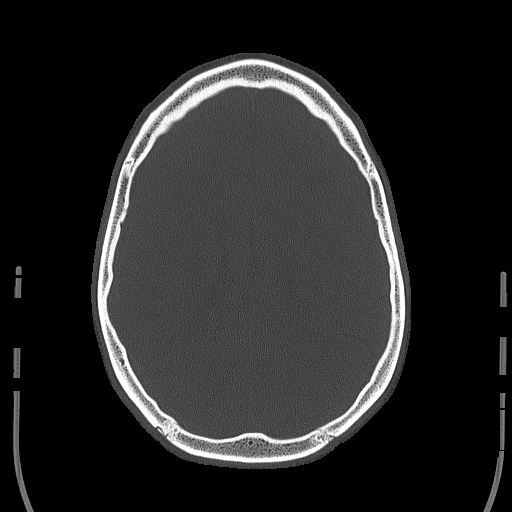
[im 48/80  brain]
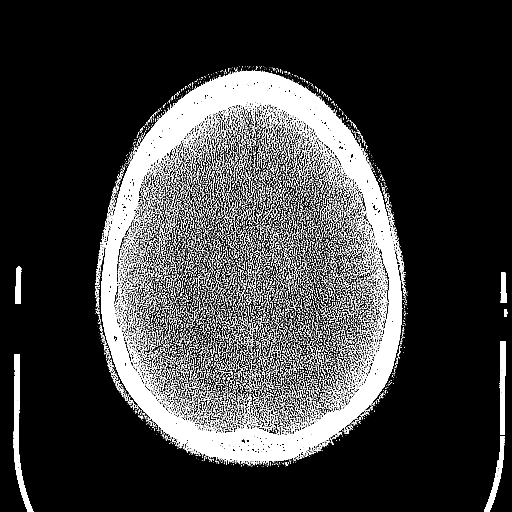
[im 52/80  brain]
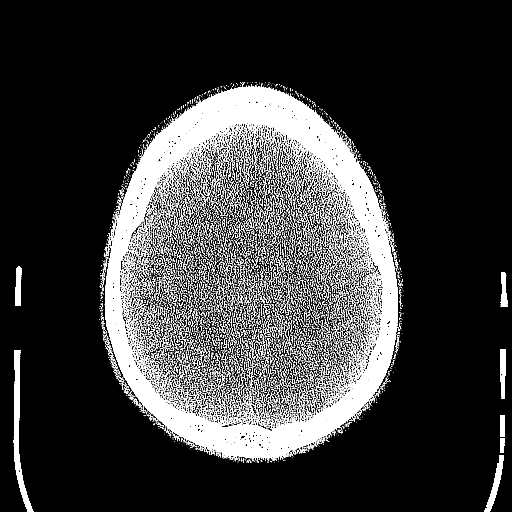
[im 56/80  brain]
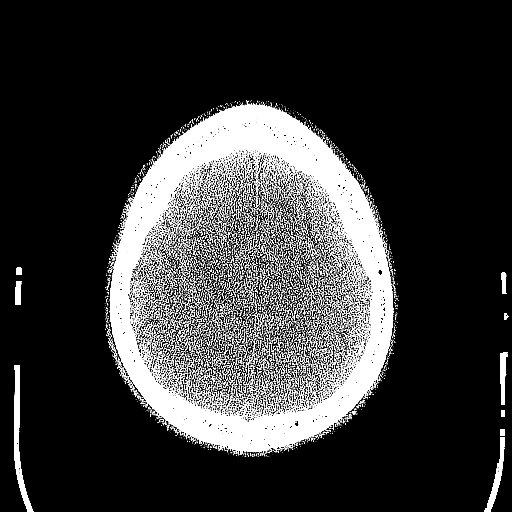
[im 60/80  brain]
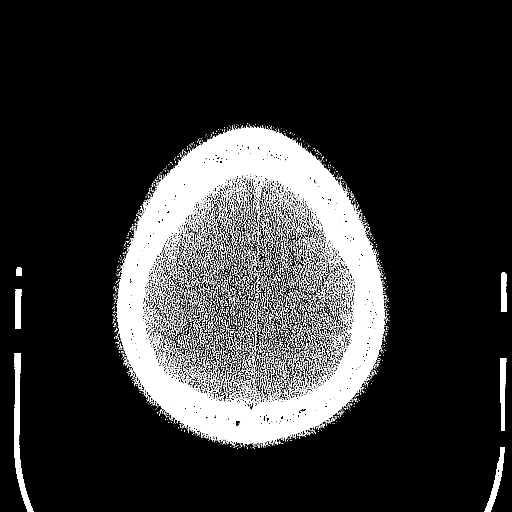
[im 60/80  bone]
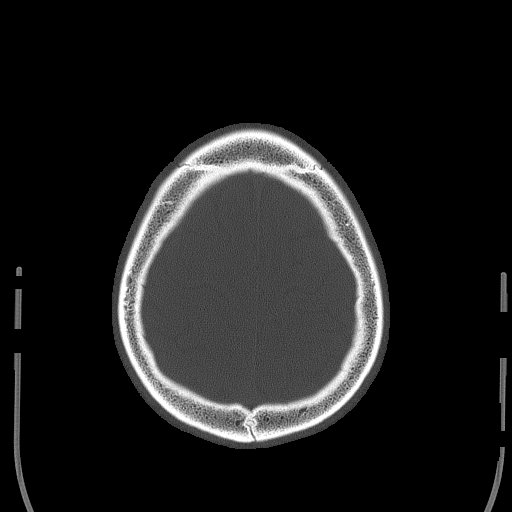
[im 68/80  brain]
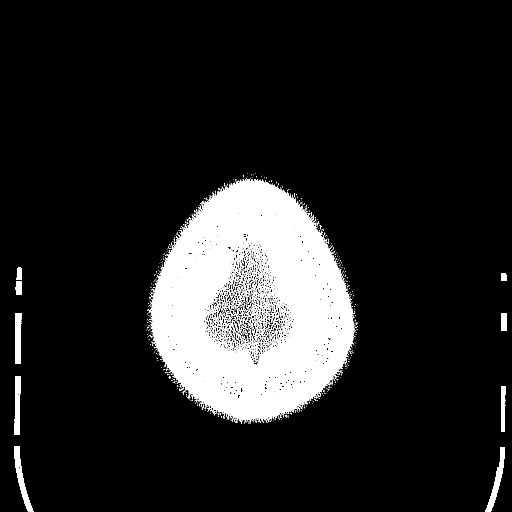
[im 72/80  brain]
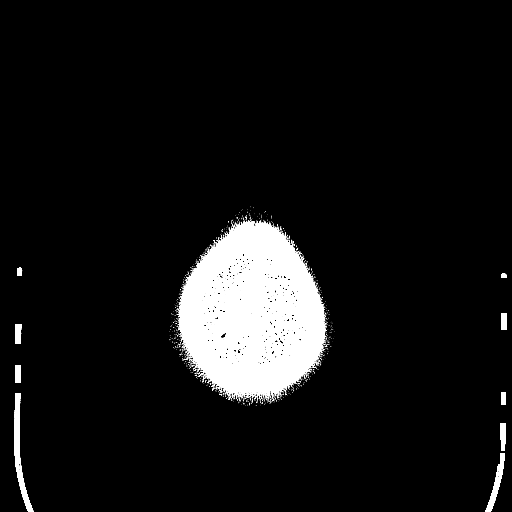
[im 76/80  brain]
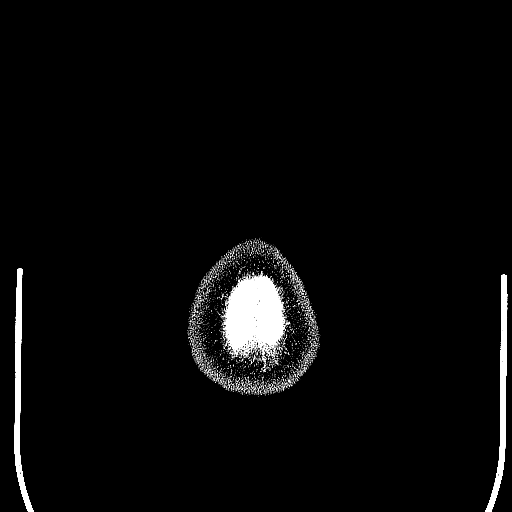

[16 of 30 positions shown; findings below may reference images not displayed]

FINDINGS: Old infarcts within the right and left thalamus. Chronic right basal
ganglia lacunar infarct. Periventricular and subcortical white
matter hypodensity compatible with chronic microvascular ischemic
changes. No evidence for acute cortically based infarct,
intracranial hemorrhage, mass lesion or mass-effect. Orbits are
unremarkable. Paranasal sinuses are well aerated. Mastoid air cells
unremarkable. Calvarium is intact.
IMPRESSION: No acute intracranial process. Chronic microvascular ischemic
changes.

## 2017-06-10 ENCOUNTER — Encounter (HOSPITAL_COMMUNITY): Payer: Self-pay | Admitting: Emergency Medicine

## 2017-06-10 ENCOUNTER — Other Ambulatory Visit: Payer: Self-pay

## 2017-06-10 DIAGNOSIS — Z5321 Procedure and treatment not carried out due to patient leaving prior to being seen by health care provider: Secondary | ICD-10-CM | POA: Diagnosis not present

## 2017-06-10 DIAGNOSIS — R42 Dizziness and giddiness: Secondary | ICD-10-CM | POA: Insufficient documentation

## 2017-06-10 LAB — URINALYSIS, ROUTINE W REFLEX MICROSCOPIC
Bacteria, UA: NONE SEEN
Bilirubin Urine: NEGATIVE
Glucose, UA: NEGATIVE mg/dL
HGB URINE DIPSTICK: NEGATIVE
KETONES UR: NEGATIVE mg/dL
Nitrite: NEGATIVE
PROTEIN: NEGATIVE mg/dL
Specific Gravity, Urine: 1.016 (ref 1.005–1.030)
pH: 5 (ref 5.0–8.0)

## 2017-06-10 LAB — BASIC METABOLIC PANEL
Anion gap: 7 (ref 5–15)
BUN: 16 mg/dL (ref 6–20)
CHLORIDE: 103 mmol/L (ref 101–111)
CO2: 25 mmol/L (ref 22–32)
Calcium: 8.9 mg/dL (ref 8.9–10.3)
Creatinine, Ser: 1.06 mg/dL (ref 0.61–1.24)
GFR calc Af Amer: >60 mL/min (ref >60)
GFR calc non Af Amer: >60 mL/min (ref >60)
GLUCOSE: 91 mg/dL (ref 65–99)
POTASSIUM: 3.7 mmol/L (ref 3.5–5.1)
Sodium: 135 mmol/L (ref 135–145)

## 2017-06-10 LAB — CBC WITH DIFFERENTIAL/PLATELET
Basophils Absolute: 0 10*3/uL (ref 0.0–0.1)
Basophils Relative: 1 %
Eosinophils Absolute: 0.3 10*3/uL (ref 0.0–0.7)
Eosinophils Relative: 5 %
HEMATOCRIT: 42.3 % (ref 39.0–52.0)
HEMOGLOBIN: 14.2 g/dL (ref 13.0–17.0)
LYMPHS ABS: 2.7 10*3/uL (ref 0.7–4.0)
LYMPHS PCT: 52 %
MCH: 29.7 pg (ref 26.0–34.0)
MCHC: 33.6 g/dL (ref 30.0–36.0)
MCV: 88.5 fL (ref 78.0–100.0)
Monocytes Absolute: 0.4 10*3/uL (ref 0.1–1.0)
Monocytes Relative: 8 %
Neutro Abs: 1.7 10*3/uL (ref 1.7–7.7)
Neutrophils Relative %: 34 %
Platelets: 269 10*3/uL (ref 150–400)
RBC: 4.78 MIL/uL (ref 4.22–5.81)
RDW: 14.4 % (ref 11.5–15.5)
WBC: 5.1 10*3/uL (ref 4.0–10.5)

## 2017-06-10 NOTE — ED Triage Notes (Signed)
Pt states for the past 2 nights he is been having dizziness only when he goes to work, he takes his BP medication and gets better, pt wants to check if he is ok. No fever or chills, no nausea or vomiting, no neuro deficit noticed on triage.

## 2017-06-11 ENCOUNTER — Emergency Department (HOSPITAL_COMMUNITY)
Admission: EM | Admit: 2017-06-11 | Discharge: 2017-06-11 | Disposition: A | Discharge status: Home / Self Care | Payer: BLUE CROSS/BLUE SHIELD | Attending: Emergency Medicine | Admitting: Emergency Medicine

## 2017-06-11 NOTE — ED Notes (Signed)
Attempted to reassess vitals pt not present in waiting area.

## 2017-12-16 ENCOUNTER — Encounter (HOSPITAL_COMMUNITY): Payer: Self-pay | Admitting: Emergency Medicine

## 2017-12-16 ENCOUNTER — Ambulatory Visit (HOSPITAL_COMMUNITY)
Admission: EM | Admit: 2017-12-16 | Discharge: 2017-12-16 | Disposition: A | Discharge status: Home / Self Care | Payer: BLUE CROSS/BLUE SHIELD | Attending: Family Medicine | Admitting: Family Medicine

## 2017-12-16 DIAGNOSIS — S61402A Unspecified open wound of left hand, initial encounter: Secondary | ICD-10-CM | POA: Diagnosis not present

## 2017-12-16 DIAGNOSIS — R6884 Jaw pain: Secondary | ICD-10-CM

## 2017-12-16 DIAGNOSIS — Z23 Encounter for immunization: Secondary | ICD-10-CM | POA: Diagnosis not present

## 2017-12-16 DIAGNOSIS — W268XXA Contact with other sharp object(s), not elsewhere classified, initial encounter: Secondary | ICD-10-CM | POA: Diagnosis not present

## 2017-12-16 MED ORDER — TETANUS-DIPHTH-ACELL PERTUSSIS 5-2.5-18.5 LF-MCG/0.5 IM SUSP
0.5000 mL | Freq: Once | INTRAMUSCULAR | Status: AC
  Administered 2017-12-16: 0.5 mL via INTRAMUSCULAR

## 2017-12-16 MED ORDER — IBUPROFEN 800 MG PO TABS: 800.0000 mg | ORAL_TABLET | Freq: Three times a day (TID) | ORAL | 0 refills | Status: DC

## 2017-12-16 MED ORDER — TETANUS-DIPHTH-ACELL PERTUSSIS 5-2.5-18.5 LF-MCG/0.5 IM SUSP
INTRAMUSCULAR | Status: AC
  Filled 2017-12-16: qty 0.5

## 2017-12-16 MED ORDER — CEPHALEXIN 500 MG PO CAPS: 500.0000 mg | ORAL_CAPSULE | Freq: Four times a day (QID) | ORAL | 0 refills | Status: DC

## 2017-12-16 MED ORDER — CEPHALEXIN 500 MG PO CAPS: 500.0000 mg | ORAL_CAPSULE | Freq: Three times a day (TID) | ORAL | 0 refills | Status: DC

## 2017-12-16 NOTE — ED Provider Notes (Signed)
[place]    CSN: [id number] Arrival date & time: 12/16/17  1123     History   Chief Complaint Chief Complaint  Patient presents with  . Lock Jaw    HPI Glenn Newton is a 47 y.o. male.   HPI  Patient has a wound on his left palm for about a week.  He cut it while putting up and down an outdoor umbrella.  His superficial wound but blood moderately.  Has been keeping a Band-Aid on it.  Has been cleaning it daily.  No evidence of infection.  No redness, no swelling, no pus.  No pain.  It feels to be hearing well. She days ago he developed some pain in his left jaw.  It hurts to open his mouth.  He does have full opening closing of the mouth.  Does not hurt to chew or bite.  No trauma to the face.  No fever or chills.  No recent infection. He feels like since his tetanus shot was 10 years ago he might have lockjaw.  He is here to be evaluated for this complaint. I did explain what lockjaw is, and the symptoms.  I told him that he did not have symptoms consistent lockjaw.  Of his tetanus is not up-to-date, and he has a current wound, and certainly a tetanus booster is reasonable.   Past Medical History:  Diagnosis Date  . High cholesterol   . Hypertension   . Hypertension   . Left ventricular hypertrophy   . Stroke [facility]) 04/2008   left thalamus/posterior limb internal capsule and right parietal subcortical [place] matter.  Marland Kitchen TIA (transient ischemic attack) 02/06/2014    Patient Active Problem List   Diagnosis Date Noted  . Hypertensive urgency 08/13/2015  . Alcohol use 08/13/2015  . TIA (transient ischemic attack) 02/06/2014  . Tinea corporis 02/06/2014  . Rash and nonspecific skin eruption 01/04/2014  . Candidal intertrigo 01/04/2014  . NEVUS 10/19/2009  . HYPERLIPIDEMIA 04/30/2009  . TOBACCO ABUSE 06/20/2008  . HYPERTENSION 05/02/2008  . CEREBROVASCULAR ACCIDENT, HX OF 05/02/2008    Past Surgical History:  Procedure Laterality Date  . NO PAST SURGERIES          Home Medications    Prior to Admission medications   Medication Sig Start Date End Date Taking? Authorizing Provider  AMLODIPINE BESYLATE PO Take 1 tablet by mouth daily.    [provider]  aspirin 325 MG tablet Take 1 tablet (325 mg total) by mouth daily. 08/14/15   Ghimire, Henreitta Leber, MD  atorvastatin (LIPITOR) 40 MG tablet Take 1 tablet (40 mg total) by mouth daily at 6 PM. 08/14/15   Ghimire, Henreitta Leber, MD  cephALEXin (KEFLEX) 500 MG capsule Take 1 capsule (500 mg total) by mouth 3 (three) times daily. 12/16/17   Raylene Everts, MD  hydrochlorothiazide (HYDRODIURIL) 25 MG tablet Take 1 tablet (25 mg total) by mouth daily. 06/24/16   Mesner, Corene Cornea, MD  ibuprofen (ADVIL,MOTRIN) 800 MG tablet Take 1 tablet (800 mg total) by mouth 3 (three) times daily. 12/16/17   Raylene Everts, MD  lisinopril (PRINIVIL,ZESTRIL) 10 MG tablet Take 1 tablet (10 mg total) by mouth daily. 06/24/16   Mesner, Corene Cornea, MD    Family History Family History  Problem Relation Age of Onset  . Stroke Unknown         first-degree relative less than 83    Social History Social History   Tobacco Use  . Smoking status:  Current Every Day Smoker    Packs/day: 2.00    Years: 23.00    Pack years: 46.00    Types: Cigarettes    Last attempt to quit: 01/14/2014    Years since quitting: 3.9  . Smokeless tobacco: Never Used  Substance Use Topics  . Alcohol use: Yes    Alcohol/week: 5.4 oz    Types: 9 Cans of beer per week  . 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   Review of Systems Review of Systems  Constitutional: Negative for chills and fever.  HENT: Negative for dental problem, ear pain, mouth sores and sore throat.        Jaw pain left  Eyes: Negative for pain and visual disturbance.  Respiratory: Negative for  cough and shortness of breath.   Cardiovascular: Negative for chest pain and palpitations.  Gastrointestinal: Negative for abdominal pain and vomiting.  Genitourinary: Negative for dysuria and hematuria.  Musculoskeletal: Negative for arthralgias and back pain.  Skin: Positive for wound. Negative for color change and rash.  Neurological: Negative for seizures and syncope.  All other systems reviewed and are negative.    Physical Exam Triage Vital Signs ED Triage Vitals [12/16/17 1156]  Enc Vitals Group     BP (!) 152/97     Pulse Rate 87     Resp 16     Temp (!) 97.3 F (36.3 C)     Temp src      SpO2 100 %     Weight      Height      Head Circumference      Peak Flow      Pain Score      Pain Loc      Pain Edu?      Excl. in [place]?    No data found.  Updated Vital Signs BP (!) 152/97   Pulse 87   Temp (!) 97.3 F (36.3 C)   Resp 16   SpO2 100%   Visual Acuity Right Eye Distance:   Left Eye Distance:   Bilateral Distance:    Right Eye Near:   Left Eye Near:    Bilateral Near:     Physical Exam  Constitutional: He appears well-developed and well-nourished. No distress.  HENT:  Head: Normocephalic and atraumatic.    Right Ear: External ear normal.  Left Ear: External ear normal.  Mouth/Throat: No oropharyngeal exudate.  Dentition appears intact.  Impacted wisdom teeth bilaterally in the lower molars.  No gum redness, periodontal disease, signs of abscess.  Eyes: Pupils are equal, round, and reactive to light. Conjunctivae are normal.  Neck: Normal range of motion.  Cardiovascular: Normal rate.  Pulmonary/Chest: Effort normal. No respiratory distress.  Abdominal: Soft. He exhibits no distension.  Musculoskeletal: Normal range of motion. He exhibits no edema.  Lymphadenopathy:    He has no cervical adenopathy.  Neurological: He is alert.  Skin: Skin is warm and dry.     UC Treatments / Results  Labs (all labs ordered are listed, but only abnormal  results are displayed) Labs Reviewed - No data to display  EKG None  Radiology No results found.  Procedures Procedures (including critical care time)  Medications Ordered in UC Medications - No data to display  Initial Impression / Assessment and Plan / UC Course  I have reviewed the triage vital signs and the nursing notes.  Pertinent labs & imaging results that were available during my care of the patient were reviewed by me and considered in my medical decision making (see chart for details).      Final Clinical Impressions(s) / UC Diagnoses   Final diagnoses:  Pain in lower jaw  Open wound of left hand with complication, initial encounter     Discharge Instructions     Take the ibuprofen 3 times a day with food for pain Take the Keflex 3 times a day as directed for infection Tetanus shot was given today Return as needed   ED Prescriptions    Medication Sig Dispense Auth. Provider   ibuprofen (ADVIL,MOTRIN) 800 MG tablet Take 1 tablet (800 mg total) by mouth 3 (three) times daily. 21 tablet Raylene Everts, MD   cephALEXin (KEFLEX) 500 MG capsule Take 1 capsule (500 mg total) by mouth 3 (three) times daily. 21 capsule Raylene Everts, MD     Controlled Substance Prescriptions [place] Controlled Substance Registry consulted? No   Raylene Everts, MD 12/16/17 [phone]

## 2017-12-16 NOTE — ED Triage Notes (Signed)
Pt states he cut his hand Saturday and states it doesn't look infected but hes worried about his jaw locking up, states he feels dizzy sometimes, pt concerned about tetanus.

## 2017-12-16 NOTE — Discharge Instructions (Signed)
Take the ibuprofen 3 times a day with food for pain Take the Keflex 3 times a day as directed for infection Tetanus shot was given today Return as needed

## 2018-08-18 ENCOUNTER — Encounter (HOSPITAL_COMMUNITY): Payer: Self-pay

## 2018-08-18 ENCOUNTER — Ambulatory Visit (HOSPITAL_COMMUNITY)
Admission: EM | Admit: 2018-08-18 | Discharge: 2018-08-18 | Disposition: A | Discharge status: Home / Self Care | Payer: Managed Care, Other (non HMO) | Attending: Family Medicine | Admitting: Family Medicine

## 2018-08-18 ENCOUNTER — Other Ambulatory Visit: Payer: Self-pay

## 2018-08-18 DIAGNOSIS — L209 Atopic dermatitis, unspecified: Secondary | ICD-10-CM | POA: Diagnosis not present

## 2018-08-18 DIAGNOSIS — B356 Tinea cruris: Secondary | ICD-10-CM

## 2018-08-18 MED ORDER — FLUOCINONIDE 0.05 % EX CREA: 1.0000 "application " | TOPICAL_CREAM | Freq: Two times a day (BID) | CUTANEOUS | 1 refills | Status: DC

## 2018-08-18 MED ORDER — CLOTRIMAZOLE 1 % EX CREA: TOPICAL_CREAM | CUTANEOUS | 0 refills | Status: DC

## 2018-08-18 NOTE — ED Triage Notes (Signed)
Pt cc has a rash on his legs x 1 year he stated .

## 2018-08-24 NOTE — ED Provider Notes (Signed)
[place]   [id number] 08/18/18 Arrival Time: 3419  ASSESSMENT & PLAN:  1. Atopic dermatitis, unspecified type   2. Tinea cruris    Meds ordered this encounter  Medications  . fluocinonide cream (LIDEX) 0.05 %    Sig: Apply 1 application topically 2 (two) times daily. Apply on to areas on your legs.    Dispense:  30 g    Refill:  1  . clotrimazole (LOTRIMIN) 1 % cream    Sig: Apply to your groin area 2 times daily for 1-2 weeks.    Dispense:  30 g    Refill:  0   Will follow up with PCP or here if worsening or failing to improve as anticipated. Reviewed expectations re: course of current medical issues. Questions answered. Outlined signs and symptoms indicating need for more acute intervention. Patient verbalized understanding. After Visit Summary given.   SUBJECTIVE:  Glenn Newton is a 48 y.o. male who presents with a skin complaint.   Location: legs and groin area Onset: gradual Duration: at least one year for areas on legs; groin rash for a few months that "flares up" occasionally Associated pruritis? Mild to moderate Associated pain? none Progression: stable  Drainage? No  Known trigger? No  New soaps/lotions/topicals/detergents/environmental exposures? No Contacts with similar? No Recent travel? No  Other associated symptoms: none Therapies tried thus far: none Arthralgia or myalgia? none Recent illness? none Fever? none No specific aggravating or alleviating factors reported.  ROS: As per HPI. All other systems negative.   OBJECTIVE: Vitals:   08/18/18 1655 08/18/18 1656  BP: (!) 145/104   Pulse: 66   Resp: 18   Temp: 97.8 F (36.6 C)   TempSrc: Oral   SpO2: 100%   Weight:  85.7 kg    General appearance: alert; no distress Lungs: clear to auscultation bilaterally Heart: regular rate and rhythm Extremities: no edema Skin: warm and dry; eczematous areas of skin on lower legs without signs of infection; mildly erythematous eruption of  inguinal areas and inner thighs consistent with tinea; no areas of fluctuance; no folliculitis; no ulcerations or painful lesions Psychological: alert and cooperative; normal mood and affect  No Known Allergies  Past Medical History:  Diagnosis Date  . High cholesterol   . Hypertension   . Hypertension   . Left ventricular hypertrophy   . Stroke [facility]) 04/2008   left thalamus/posterior limb internal capsule and right parietal subcortical [place] matter.  Marland Kitchen TIA (transient ischemic attack) 02/06/2014   Social History   Socioeconomic History  . Marital status: Legally Separated    Spouse name: Not on file  . Number of children: Not on file  . Years of education: Not on file  . Highest education level: Not on file  Occupational History  . Occupation:  unemployed for 2 years  Social Needs  . Financial resource strain: Not on file  . Food insecurity:    Worry: Not on file    Inability: Not on file  . Transportation needs:    Medical: Not on file    Non-medical: Not on file  Tobacco Use  . Smoking status: Current Every Day Smoker    Packs/day: 2.00    Years: 23.00    Pack years: 46.00    Types: Cigarettes    Last attempt to quit: 01/14/2014    Years since quitting: 4.6  . Smokeless tobacco: Never Used  Substance and Sexual Activity  . Alcohol use: Yes    Alcohol/week: 9.0  standard drinks    Types: 9 Cans of beer per week  . Drug use: Yes    Types: Cocaine    Comment: 02/07/2014 "exposed to cocaine in 1996; involuntarily; I almost died; someone did this to me; I didn't do it to myself"  . Sexual activity: Not Currently  Lifestyle  . Physical activity:    Days per week: Not on file    Minutes per session: Not on file  . Stress: Not on file  Relationships  . Social connections:    Talks on phone: Not on file    Gets together: Not on file    Attends religious service: Not on file    Active member of club or organization: Not on file    Attends meetings of clubs or  organizations: Not on file    Relationship status: Not on file  . Intimate partner violence:    Fear of current or ex partner: Not on file    Emotionally abused: Not on file    Physically abused: Not on file    Forced sexual activity: Not on file  Other Topics Concern  . Not on file  Social History Narrative    Used to work in a warehouse currently drawing unemployment.       High school education.       Married but happily separated.       A total of 6 kids 2 from previous relationship and 4 with wife.   Family History  Problem Relation Age of Onset  . Stroke Other         first-degree relative less than 60   Past Surgical History:  Procedure Laterality Date  . NO PAST SURGERIES       Vanessa Kick, MD 09/01/18 [phone]

## 2019-10-14 ENCOUNTER — Ambulatory Visit: Payer: Self-pay | Attending: Internal Medicine

## 2019-10-14 DIAGNOSIS — Z23 Encounter for immunization: Secondary | ICD-10-CM

## 2019-10-14 NOTE — Progress Notes (Signed)
   Covid-19 Vaccination Clinic  Name:  Glenn Newton    MRN: [id number] DOB: Oct 07, 1970  10/14/2019  [name] was observed post Covid-19 immunization for 15 minutes without incident. He was provided with Vaccine Information Sheet and instruction to access the V-Safe system.   [name] was instructed to call 911 with any severe reactions post vaccine: Marland Kitchen Difficulty breathing  . Swelling of face and throat  . A fast heartbeat  . A bad rash all over body  . Dizziness and weakness   Immunizations Administered    Name Date Dose VIS Date Route   Pfizer COVID-19 Vaccine 10/14/2019 11:59 AM 0.3 mL 06/23/2019 Intramuscular   Manufacturer: [employer], [employer]   Lot: [id number]   [place]: [id number]

## 2019-11-08 ENCOUNTER — Ambulatory Visit: Payer: Self-pay | Attending: Internal Medicine

## 2019-11-08 DIAGNOSIS — Z23 Encounter for immunization: Secondary | ICD-10-CM

## 2019-11-08 NOTE — Progress Notes (Signed)
   Covid-19 Vaccination Clinic  Name:  Glenn Newton    MRN: [id number] DOB: October 21, 1970  11/08/2019  Mr. Dawkins was observed post Covid-19 immunization for 15 minutes without incident. He was provided with Vaccine Information Sheet and instruction to access the V-Safe system.   Mr. Brincefield was instructed to call 911 with any severe reactions post vaccine: Marland Kitchen Difficulty breathing  . Swelling of face and throat  . A fast heartbeat  . A bad rash all over body  . Dizziness and weakness   Immunizations Administered    Name Date Dose VIS Date Route   Pfizer COVID-19 Vaccine 11/08/2019 12:28 PM 0.3 mL 09/06/2018 Intramuscular   Manufacturer: [place]   Lot: [id number]   [place]: [id number]

## 2019-11-30 ENCOUNTER — Encounter (HOSPITAL_COMMUNITY): Payer: Self-pay

## 2019-11-30 ENCOUNTER — Telehealth (HOSPITAL_COMMUNITY): Payer: Self-pay

## 2019-11-30 ENCOUNTER — Other Ambulatory Visit: Payer: Self-pay

## 2019-11-30 ENCOUNTER — Ambulatory Visit (HOSPITAL_COMMUNITY)
Admission: EM | Admit: 2019-11-30 | Discharge: 2019-11-30 | Disposition: A | Discharge status: Home / Self Care | Payer: 59 | Attending: Family Medicine | Admitting: Family Medicine

## 2019-11-30 DIAGNOSIS — I10 Essential (primary) hypertension: Secondary | ICD-10-CM

## 2019-11-30 DIAGNOSIS — Z760 Encounter for issue of repeat prescription: Secondary | ICD-10-CM

## 2019-11-30 MED ORDER — LISINOPRIL-HYDROCHLOROTHIAZIDE 20-25 MG PO TABS: 1.0000 | ORAL_TABLET | Freq: Every day | ORAL | 1 refills | Status: DC

## 2019-11-30 MED ORDER — AMLODIPINE BESYLATE 10 MG PO TABS: 10.0000 mg | ORAL_TABLET | Freq: Every day | ORAL | 1 refills | Status: DC

## 2019-11-30 NOTE — ED Provider Notes (Signed)
[place]    CSN: [id number] Arrival date & time: 11/30/19  1125      History   Chief Complaint Chief Complaint  Patient presents with  . Hypertension  . Medication Refill    HPI Glenn Newton is a 50 y.o. male.   HPI  Patient is here for refill of his blood pressure medication He also used to take a statin but states he stopped this because it made his left arm feel numb He is a smoker, 2 packs a day I discussed with him that with cigarette smoking, hyperlipidemia, and blood pressure (especially untreated) he is at risk of heart disease and stroke.  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 He is strongly recommended follow-up with a primary care doctor for additional preventative medical care, and ongoing maintenance  Past Medical History:  Diagnosis Date  . High cholesterol   . Hypertension   . Hypertension   . Left ventricular hypertrophy   . Stroke [facility]) 04/2008   left thalamus/posterior limb internal capsule and right parietal subcortical G[place]an matter.  Marland Kitchen TIA (transient ischemic attack) 02/06/2014    Patient Active Problem List   Diagnosis Date Noted  . Hypertensive urgency 08/13/2015  . Alcohol use 08/13/2015  . TIA (transient ischemic attack) 02/06/2014  . Tinea corporis 02/06/2014  . Rash and nonspecific skin eruption 01/04/2014  . Candidal intertrigo 01/04/2014  . NEVUS 10/19/2009  . HYPERLIPIDEMIA 04/30/2009  . TOBACCO ABUSE 06/20/2008  . HYPERTENSION 05/02/2008  . CEREBROVASCULAR ACCIDENT, HX OF 05/02/2008    Past Surgical History:  Procedure Laterality Date  . NO PAST SURGERIES         Home Medications    Prior to Admission medications   Medication Sig Start Date End Date Taking? Authorizing Provider  amLODipine (NORVASC) 10 MG tablet Take 1 tablet (10 mg total) by mouth daily. 11/30/19   Raylene Everts, MD  aspirin 325 MG tablet Take 1 tablet (325 mg total) by mouth daily. 08/14/15   Ghimire, Henreitta Leber, MD    lisinopril-hydrochlorothiazide (ZESTORETIC) 20-25 MG tablet Take 1 tablet by mouth daily. 11/30/19   Raylene Everts, MD  atorvastatin (LIPITOR) 40 MG tablet Take 1 tablet (40 mg total) by mouth daily at 6 PM. 08/14/15 11/30/19  Ghimire, Henreitta Leber, MD  hydrochlorothiazide (HYDRODIURIL) 25 MG tablet Take 1 tablet (25 mg total) by mouth daily. 06/24/16 11/30/19  Mesner, Corene Cornea, MD  lisinopril (PRINIVIL,ZESTRIL) 10 MG tablet Take 1 tablet (10 mg total) by mouth daily. 06/24/16 11/30/19  Mesner, Corene Cornea, MD    Family History Family History  Problem Relation Age of Onset  . Stroke Other         first-degree relative less than 60  . Hypertension Mother   . Diabetes Mother   . Hypertension Father   . Congestive Heart Failure Father     Social History Social History   Tobacco Use  . Smoking status: Current Every Day Smoker    Packs/day: 2.00    Years: 23.00    Pack years: 46.00    Types: Cigarettes    Last attempt to quit: 01/14/2014    Years since quitting: 5.8  . Smokeless tobacco: Never Used  Substance Use Topics  . Alcohol use: Yes    Alcohol/week: 9.0 standard drinks    Types: 9 Cans of beer per week  . Drug use: Yes    Types: Cocaine    Comment: 02/07/2014 "exposed to cocaine in 1996; involuntarily; I  almost died; someone did this to me; I didn't do it to myself"     Allergies   Patient has no known allergies.   Review of Systems Review of Systems  Respiratory: Negative for shortness of breath.   Cardiovascular: Negative for chest pain.  Neurological: Negative for dizziness and headaches.     Physical Exam Triage Vital Signs ED Triage Vitals  Enc Vitals Group     BP 11/30/19 1214 (!) 147/102     Pulse Rate 11/30/19 1214 77     Resp 11/30/19 1214 20     Temp 11/30/19 1214 98.6 F (37 C)     Temp Source 11/30/19 1214 Oral     SpO2 11/30/19 1214 100 %     Weight --      Height --      Head Circumference --      Peak Flow --      Pain Score 11/30/19 1209 0      Pain Loc --      Pain Edu? --      Excl. in [place]? --    No data found.  Updated Vital Signs BP (!) 147/102 (BP Location: Right Arm)   Pulse 77   Temp 98.6 F (37 C) (Oral)   Resp 20   SpO2 100%       Physical Exam Constitutional:      General: He is not in acute distress.    Appearance: He is well-developed.  HENT:     Head: Normocephalic and atraumatic.     Mouth/Throat:     Comments: Mask in place Eyes:     Conjunctiva/sclera: Conjunctivae normal.     Pupils: Pupils are equal, round, and reactive to light.  Cardiovascular:     Rate and Rhythm: Normal rate and regular rhythm.     Heart sounds: Normal heart sounds.  Pulmonary:     Effort: Pulmonary effort is normal. No respiratory distress.     Breath sounds: Normal breath sounds.  Musculoskeletal:        General: Normal range of motion.     Cervical back: Normal range of motion.  Skin:    General: Skin is warm and dry.  Neurological:     Mental Status: He is alert.  Psychiatric:        Mood and Affect: Mood normal.        Behavior: Behavior normal.      UC Treatments / Results  Labs (all labs ordered are listed, but only abnormal results are displayed) Labs Reviewed - No data to display  EKG   Radiology No results found.  Procedures Procedures (including critical care time)  Medications Ordered in UC Medications - No data to display  Initial Impression / Assessment and Plan / UC Course  I have reviewed the triage vital signs and the nursing notes.  Pertinent labs & imaging results that were available during my care of the patient were reviewed by me and considered in my medical decision making (see chart for details).      Final Clinical Impressions(s) / UC Diagnoses   Final diagnoses:  Essential hypertension  Encounter for medication refill     Discharge Instructions     Take your BP medicine every day Call the Internal Medicine clinic to get a check up I am glad you are trying to  [place]   ED Prescriptions    Medication Sig Dispense Auth. Provider   lisinopril-hydrochlorothiazide (ZESTORETIC) 20-25 MG tablet Take  1 tablet by mouth daily. 30 tablet Raylene Everts, MD   amLODipine (NORVASC) 10 MG tablet Take 1 tablet (10 mg total) by mouth daily. 30 tablet Raylene Everts, MD     PDMP not reviewed this encounter.   Raylene Everts, MD 11/30/19 [phone]

## 2019-11-30 NOTE — ED Triage Notes (Signed)
Pt is here with needing BP meds refilled Lisinopril/HCTZ 20-25MG  and Amplodipine 10MG , pt states he feels bad he has only taken Amplodipine since he ran out of his Lisinopril 30 days ago.

## 2019-11-30 NOTE — Discharge Instructions (Addendum)
Take your BP medicine every day Call the Internal Medicine clinic to get a check up I am glad you are trying to [place]

## 2020-05-11 ENCOUNTER — Ambulatory Visit: Payer: 59

## 2021-02-10 ENCOUNTER — Encounter (HOSPITAL_COMMUNITY): Payer: Self-pay | Admitting: Emergency Medicine

## 2021-02-10 ENCOUNTER — Emergency Department (HOSPITAL_COMMUNITY): Payer: 59

## 2021-02-10 ENCOUNTER — Emergency Department (HOSPITAL_COMMUNITY)
Admission: EM | Admit: 2021-02-10 | Discharge: 2021-02-10 | Disposition: A | Discharge status: Home / Self Care | Payer: 59 | Attending: Emergency Medicine | Admitting: Emergency Medicine

## 2021-02-10 DIAGNOSIS — F17210 Nicotine dependence, cigarettes, uncomplicated: Secondary | ICD-10-CM | POA: Diagnosis not present

## 2021-02-10 DIAGNOSIS — R079 Chest pain, unspecified: Secondary | ICD-10-CM | POA: Diagnosis not present

## 2021-02-10 DIAGNOSIS — I10 Essential (primary) hypertension: Secondary | ICD-10-CM | POA: Insufficient documentation

## 2021-02-10 DIAGNOSIS — F410 Panic disorder [episodic paroxysmal anxiety] without agoraphobia: Secondary | ICD-10-CM | POA: Diagnosis not present

## 2021-02-10 DIAGNOSIS — Z79899 Other long term (current) drug therapy: Secondary | ICD-10-CM | POA: Insufficient documentation

## 2021-02-10 DIAGNOSIS — Z7982 Long term (current) use of aspirin: Secondary | ICD-10-CM | POA: Diagnosis not present

## 2021-02-10 DIAGNOSIS — F419 Anxiety disorder, unspecified: Secondary | ICD-10-CM | POA: Diagnosis not present

## 2021-02-10 DIAGNOSIS — R61 Generalized hyperhidrosis: Secondary | ICD-10-CM | POA: Diagnosis not present

## 2021-02-10 LAB — RAPID URINE DRUG SCREEN, HOSP PERFORMED
Amphetamines: NOT DETECTED
Barbiturates: NOT DETECTED
Benzodiazepines: NOT DETECTED
Cocaine: NOT DETECTED
Opiates: NOT DETECTED
Tetrahydrocannabinol: NOT DETECTED

## 2021-02-10 LAB — BASIC METABOLIC PANEL
Anion gap: 8 (ref 5–15)
BUN: 13 mg/dL (ref 6–20)
CO2: 24 mmol/L (ref 22–32)
Calcium: 9.5 mg/dL (ref 8.9–10.3)
Chloride: 104 mmol/L (ref 98–111)
Creatinine, Ser: 1.12 mg/dL (ref 0.61–1.24)
GFR, Estimated: >60 mL/min (ref >60)
Glucose, Bld: 115 mg/dL — ABNORMAL HIGH (ref 70–99)
Potassium: 4.1 mmol/L (ref 3.5–5.1)
Sodium: 136 mmol/L (ref 135–145)

## 2021-02-10 LAB — TROPONIN I (HIGH SENSITIVITY)
Troponin I (High Sensitivity): 2 ng/L (ref <18)
Troponin I (High Sensitivity): 2 ng/L (ref <18)

## 2021-02-10 LAB — CBC
HCT: 44.2 % (ref 39.0–52.0)
Hemoglobin: 14.7 g/dL (ref 13.0–17.0)
MCH: 29.1 pg (ref 26.0–34.0)
MCHC: 33.3 g/dL (ref 30.0–36.0)
MCV: 87.4 fL (ref 80.0–100.0)
Platelets: 318 10*3/uL (ref 150–400)
RBC: 5.06 MIL/uL (ref 4.22–5.81)
RDW: 14.2 % (ref 11.5–15.5)
WBC: 4.9 10*3/uL (ref 4.0–10.5)
nRBC: 0 % (ref 0.0–0.2)

## 2021-02-10 NOTE — ED Provider Notes (Signed)
Emergency Medicine Provider Triage Evaluation Note  Glenn Newton , a 50 y.o. male  was evaluated in triage.  Pt complains of diaphoresis and anxiety that started pta. States sxs started about 10-15 minutes after picking up a dollar bill on the floor at [employer]. Reports some mild chest discomfort as well.   Review of Systems  Positive: Chest pain, anxiety, diaphoresis, dizziness Negative: sob  Physical Exam  BP (!) 153/100   Pulse 96   Temp 98.4 F (36.9 C) (Oral)   Resp 20   SpO2 100%  Gen:   Awake, no distress   Resp:  Normal effort  MSK:   Moves extremities without difficulty Other:  Heart rrr, lungs ctab  Medical Decision Making  Medically screening exam initiated at 1:01 PM.  Appropriate orders placed.  JOHANNES DADDIO was informed that the remainder of the evaluation will be completed by another provider, this initial triage assessment does not replace that evaluation, and the importance of remaining in the ED until their evaluation is complete.     Rodney Booze, PA-C 02/10/21 1304    Daleen Bo, MD 02/10/21 [phone]

## 2021-02-10 NOTE — ED Provider Notes (Signed)
[facility] DEPT Provider Note   CSN: [id number] Arrival date & time: 02/10/21  1250     History Chief Complaint  Patient presents with   Anxiety    KASIR HEINL is a 50 y.o. male.  Mr. Scianna presented to the ED complaining that he did not feel well.  He was in [employer], and when he bent over to pick up a dollar bill, he started to feel anxious and nonspecifically weird. He also had diaphoresis. He states this happened immediately after touching the dollar bill, and, after he read an article on his phone a couple weeks ago, he became aware that there can be drugs laced on dollar bills.  Therefore, he began to feel immediately worried that he has somehow contacted fentanyl.   Anxiety Associated symptoms include chest pain. Pertinent negatives include no abdominal pain and no shortness of breath.   HPI: A 50 year old patient with a history of hypertension and obesity presents for evaluation of chest pain. Initial onset of pain was approximately 1-3 hours ago. The patient's chest pain is not worse with exertion. The patient reports some diaphoresis. The patient's chest pain is not middle- or left-sided, is not well-localized, is not described as heaviness/pressure/tightness, is not sharp and does not radiate to the arms/jaw/neck. The patient does not complain of nausea. The patient has no history of stroke, has no history of peripheral artery disease, has not smoked in the past 90 days, denies any history of treated diabetes, has no relevant family history of coronary artery disease (first degree relative at less than age 88) and has no history of hypercholesterolemia.   Past Medical History:  Diagnosis Date   High cholesterol    Hypertension    Hypertension    Left ventricular hypertrophy    Stroke ([place]) 04/2008   left thalamus/posterior limb internal capsule and right parietal subcortical Gall matter.   TIA (transient ischemic attack) 02/06/2014     Patient Active Problem List   Diagnosis Date Noted   Hypertensive urgency 08/13/2015   Alcohol use 08/13/2015   TIA (transient ischemic attack) 02/06/2014   Tinea corporis 02/06/2014   Rash and nonspecific skin eruption 01/04/2014   Candidal intertrigo 01/04/2014   NEVUS 10/19/2009   HYPERLIPIDEMIA 04/30/2009   TOBACCO ABUSE 06/20/2008   HYPERTENSION 05/02/2008   CEREBROVASCULAR ACCIDENT, HX OF 05/02/2008    Past Surgical History:  Procedure Laterality Date   NO PAST SURGERIES         Family History  Problem Relation Age of Onset   Stroke Other         first-degree relative less than 79   Hypertension Mother    Diabetes Mother    Hypertension Father    Congestive Heart Failure Father     Social History   Tobacco Use   Smoking status: Every Day    Packs/day: 2.00    Years: 23.00    Pack years: 46.00    Types: Cigarettes    Last attempt to quit: 01/14/2014    Years since quitting: 7.0   Smokeless tobacco: Never  Substance Use Topics   Alcohol use: Yes    Alcohol/week: 9.0 standard drinks    Types: 9 Cans of beer per week   Drug use: Yes    Types: Cocaine    Comment: 02/07/2014 "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 Authorizing Provider  amLODipine (NORVASC) 10 MG tablet Take 1 tablet (10 mg total) by mouth daily. 11/30/19   Raylene Everts, MD  aspirin 325 MG tablet Take 1 tablet (325 mg total) by mouth daily. 08/14/15   Ghimire, Henreitta Leber, MD  lisinopril-hydrochlorothiazide (ZESTORETIC) 20-25 MG tablet Take 1 tablet by mouth daily. 11/30/19   Raylene Everts, MD  atorvastatin (LIPITOR) 40 MG tablet Take 1 tablet (40 mg total) by mouth daily at 6 PM. 08/14/15 11/30/19  Ghimire, Henreitta Leber, MD  hydrochlorothiazide (HYDRODIURIL) 25 MG tablet Take 1 tablet (25 mg total) by mouth daily. 06/24/16  11/30/19  Mesner, Corene Cornea, MD  lisinopril (PRINIVIL,ZESTRIL) 10 MG tablet Take 1 tablet (10 mg total) by mouth daily. 06/24/16 11/30/19  Mesner, Corene Cornea, MD    Allergies    Patient has no known allergies.  Review of Systems   Review of Systems  Constitutional:  Positive for diaphoresis. Negative for chills and fever.  HENT:  Negative for ear pain and sore throat.   Eyes:  Negative for pain and visual disturbance.  Respiratory:  Negative for cough and shortness of breath.   Cardiovascular:  Positive for chest pain. Negative for palpitations.  Gastrointestinal:  Negative for abdominal pain and vomiting.  Genitourinary:  Negative for dysuria and hematuria.  Musculoskeletal:  Negative for arthralgias and back pain.  Skin:  Negative for color change and rash.  Neurological:  Negative for seizures and syncope.  All other systems reviewed and are negative.  Physical Exam Updated Vital Signs BP (!) 139/99   Pulse 83   Temp 98.4 F (36.9 C) (Oral)   Resp 13   SpO2 100%   Physical Exam Vitals and nursing note reviewed.  Constitutional:      Appearance: Normal appearance.  HENT:     Head: Normocephalic and atraumatic.  Cardiovascular:     Rate and Rhythm: Normal rate and regular rhythm.     Heart sounds: Normal heart sounds.  Pulmonary:     Effort: Pulmonary effort is normal.     Breath sounds: Normal breath sounds.  Abdominal:     General: There is no distension.     Tenderness: There is no abdominal tenderness. There is no guarding.  Musculoskeletal:     Cervical back: Normal range of motion.     Right lower leg: No edema.     Left lower leg: No edema.  Skin:    General: Skin is warm and dry.  Neurological:     General: No focal deficit present.     Mental Status: He is alert and oriented to person, place, and time.  Psychiatric:        Mood and Affect: Mood normal.        Behavior: Behavior normal.    ED Results / Procedures / Treatments   Labs (all labs ordered are  listed, but only abnormal results are displayed) Labs Reviewed  BASIC METABOLIC PANEL - Abnormal; Notable for the following components:      Result Value   Glucose, Bld 115 (*)    All other components within normal limits  CBC  RAPID URINE DRUG SCREEN, HOSP PERFORMED  TROPONIN I (HIGH SENSITIVITY)  TROPONIN I (HIGH SENSITIVITY)    EKG EKG Interpretation  Date/Time:  Monday February 10 2021 13:11:03 EDT Ventricular Rate:  85 PR Interval:  166 QRS Duration: 86 QT Interval:  364 QTC Calculation: 433 R Axis:   58  Text Interpretation: Normal sinus rhythm T wave abnormality, consider inferior ischemia Abnormal ECG somewhat improved from prior in 2017 in which he also had lateral T wave inversions Confirmed by Lorre Munroe ([id number]) on 02/10/2021 1:13:53 PM  Radiology DG Chest Port 1 View  Result Date: 02/10/2021 CLINICAL DATA:  Chest pain EXAM: PORTABLE CHEST 1 VIEW COMPARISON:  08/13/2015 FINDINGS: The heart size and mediastinal contours are within normal limits. Both lungs are clear. The visualized skeletal structures are unremarkable. IMPRESSION: No acute abnormality of the lungs. Electronically Signed   By: Eddie Candle M.D.   On: 02/10/2021 14:00    Procedures Procedures   Medications Ordered in ED Medications - No data to display  ED Course  I have reviewed the triage vital signs and the nursing notes.  Pertinent labs & imaging results that were available during my care of the patient were reviewed by me and considered in my medical decision making (see chart for details).    MDM Rules/Calculators/A&P HEAR Score: 3                         Abie A President history of hypertension and hyperlipidemia.  He presents for evaluation after starting to feel anxious and diaphoretic earlier today.  He attributes this to picking up a dollar bill, and based on an article he read on his phone, he is concerned that he might have contacted some drugs.  He will be evaluated for evidence of ACS.  He  did request that a drug screen be performed. Final Clinical Impression(s) / ED Diagnoses Final diagnoses:  Diaphoresis  Panic    Rx / DC Orders ED Discharge Orders     None        Arnaldo Natal, MD 02/11/21 0800

## 2021-02-10 NOTE — ED Triage Notes (Signed)
Patient presents with c/o anxiety and diaphoresis after picking up a dollar bill from the floor at [employer]. States he started "feeling weird" after picking it up. Reports he saw online where people are putting fentanyl in money.

## 2021-02-10 NOTE — ED Provider Notes (Signed)
Patient signed out to me at 3:30 PM.  Here for anxiety, chest pain.  Concerned that he might of been exposed to some sort of drug on a dollar bill that he picked up prior to arrival.  Pending repeat troponin.  Lab work overall unremarkable.  Not having any chest pain.  Second troponin was normal.  Drug screen was negative.  Given reassurance and discharged from the ED in good condition.  This chart was dictated using voice recognition software.  Despite best efforts to proofread,  errors can occur which can change the documentation meaning.    Lennice Sites, DO 02/10/21 1719

## 2021-02-22 ENCOUNTER — Emergency Department (HOSPITAL_COMMUNITY): Payer: 59

## 2021-02-22 ENCOUNTER — Other Ambulatory Visit: Payer: Self-pay

## 2021-02-22 ENCOUNTER — Emergency Department (HOSPITAL_COMMUNITY)
Admission: EM | Admit: 2021-02-22 | Discharge: 2021-02-22 | Disposition: A | Discharge status: Home / Self Care | Payer: 59 | Attending: Emergency Medicine | Admitting: Emergency Medicine

## 2021-02-22 DIAGNOSIS — Z7982 Long term (current) use of aspirin: Secondary | ICD-10-CM | POA: Diagnosis not present

## 2021-02-22 DIAGNOSIS — R0789 Other chest pain: Secondary | ICD-10-CM

## 2021-02-22 DIAGNOSIS — R079 Chest pain, unspecified: Secondary | ICD-10-CM | POA: Insufficient documentation

## 2021-02-22 DIAGNOSIS — F17210 Nicotine dependence, cigarettes, uncomplicated: Secondary | ICD-10-CM | POA: Insufficient documentation

## 2021-02-22 DIAGNOSIS — I10 Essential (primary) hypertension: Secondary | ICD-10-CM | POA: Insufficient documentation

## 2021-02-22 DIAGNOSIS — Z79899 Other long term (current) drug therapy: Secondary | ICD-10-CM | POA: Diagnosis not present

## 2021-02-22 LAB — CBC
HCT: 41.1 % (ref 39.0–52.0)
Hemoglobin: 13.4 g/dL (ref 13.0–17.0)
MCH: 28.9 pg (ref 26.0–34.0)
MCHC: 32.6 g/dL (ref 30.0–36.0)
MCV: 88.8 fL (ref 80.0–100.0)
Platelets: 236 10*3/uL (ref 150–400)
RBC: 4.63 MIL/uL (ref 4.22–5.81)
RDW: 13.9 % (ref 11.5–15.5)
WBC: 5 10*3/uL (ref 4.0–10.5)
nRBC: 0 % (ref 0.0–0.2)

## 2021-02-22 LAB — TROPONIN I (HIGH SENSITIVITY)
Troponin I (High Sensitivity): 7 ng/L (ref <18)
Troponin I (High Sensitivity): 7 ng/L (ref <18)

## 2021-02-22 LAB — BASIC METABOLIC PANEL
Anion gap: 9 (ref 5–15)
BUN: 20 mg/dL (ref 6–20)
CO2: 21 mmol/L — ABNORMAL LOW (ref 22–32)
Calcium: 8.8 mg/dL — ABNORMAL LOW (ref 8.9–10.3)
Chloride: 100 mmol/L (ref 98–111)
Creatinine, Ser: 1.22 mg/dL (ref 0.61–1.24)
GFR, Estimated: >60 mL/min (ref >60)
Glucose, Bld: 111 mg/dL — ABNORMAL HIGH (ref 70–99)
Potassium: 3.6 mmol/L (ref 3.5–5.1)
Sodium: 130 mmol/L — ABNORMAL LOW (ref 135–145)

## 2021-02-22 MED ORDER — ALUM & MAG HYDROXIDE-SIMETH 200-200-20 MG/5ML PO SUSP
30.0000 mL | Freq: Once | ORAL | Status: AC
  Administered 2021-02-22: 30 mL via ORAL
  Filled 2021-02-22: qty 30

## 2021-02-22 NOTE — ED Triage Notes (Signed)
Pt denied any active chest pain at time of triage and states "he feels good".

## 2021-02-22 NOTE — ED Triage Notes (Signed)
Pt reported to ED with c/o CP earlier in the day at approximately 3-4pm. Pt states pain was severe and increased with movement. Denies any chest pain at this time. Denies any n/v, SOB or pain radiation at time of episode earlier in the day.

## 2021-02-22 NOTE — ED Provider Notes (Signed)
Care of patient assumed from Dr. Leonette Monarch, at 7:30 AM.  Presents for chest pain yesterday that has resolved.  Currently awaiting delta troponin.  If normal, he can go home. Physical Exam  BP 121/90   Pulse 77   Temp 98.2 F (36.8 C) (Oral)   Resp 17   Ht '5\' 5"'$  (1.651 m)   Wt 95.3 kg   SpO2 100%   BMI 34.95 kg/m   Physical Exam On assessment, patient sleeping.  Vital signs normal.  Breathing even unlabored.  No current chest pain. ED Course/Procedures     Procedures  MDM  Patient presented for a transient episode of chest pain yesterday afternoon.  Underwent ACS work-up in the ED.  Low risk of ACS by hear score.  Troponins were 7-->7.  Patient asymptomatic while in the ED.  Patient was discharged in good condition.       Godfrey Pick, MD 02/22/21 [phone]

## 2021-02-22 NOTE — ED Notes (Signed)
Patient discharge instructions and follow up care reviewed with the patient. The patient verbalized understanding of instructions. Patient discharged.

## 2021-02-22 NOTE — ED Provider Notes (Signed)
[facility] EMERGENCY DEPARTMENT Provider Note  CSN: [id number] Arrival date & time: 02/22/21 0340  Chief Complaint(s) Chest Pain  HPI SOHIL ELIZARDO is a 50 y.o. male    Chest Pain Pain location:  L chest Pain quality: sharp   Pain radiates to:  Upper back Pain severity:  Moderate Onset quality:  Sudden Duration:  15 minutes Timing:  Constant Progression:  Resolved Chronicity:  New Context: at rest   Relieved by: self resolved. Worsened by:  Nothing Associated symptoms: no cough, no fatigue, no fever, no lower extremity edema, no nausea, no shortness of breath and no vomiting   Risk factors: high cholesterol, hypertension and male sex    Past Medical History Past Medical History:  Diagnosis Date   High cholesterol    Hypertension    Hypertension    Left ventricular hypertrophy    Stroke ([place]) 04/2008   left thalamus/posterior limb internal capsule and right parietal subcortical [place] matter.   TIA (transient ischemic attack) 02/06/2014   Patient Active Problem List   Diagnosis Date Noted   Hypertensive urgency 08/13/2015   Alcohol use 08/13/2015   TIA (transient ischemic attack) 02/06/2014   Tinea corporis 02/06/2014   Rash and nonspecific skin eruption 01/04/2014   Candidal intertrigo 01/04/2014   NEVUS 10/19/2009   HYPERLIPIDEMIA 04/30/2009   TOBACCO ABUSE 06/20/2008   HYPERTENSION 05/02/2008   CEREBROVASCULAR ACCIDENT, HX OF 05/02/2008   Home Medication(s) Prior to Admission medications   Medication Sig Start Date End Date Taking? Authorizing Provider  amLODipine (NORVASC) 10 MG tablet Take 1 tablet (10 mg total) by mouth daily. 11/30/19   Raylene Everts, MD  aspirin 325 MG tablet Take 1 tablet (325 mg total) by mouth daily. 08/14/15   Ghimire, Henreitta Leber, MD  atorvastatin (LIPITOR) 40 MG tablet Take 40 mg by mouth daily. 12/02/20   [provider]  lisinopril-hydrochlorothiazide (ZESTORETIC) 20-25 MG tablet Take 1 tablet by mouth  daily. 11/30/19   Raylene Everts, MD  SKYRIZI PEN 150 MG/ML SOAJ Inject 150 mg into the skin every 4 (four) months. 01/16/21   [provider]  hydrochlorothiazide (HYDRODIURIL) 25 MG tablet Take 1 tablet (25 mg total) by mouth daily. 06/24/16 11/30/19  Mesner, Corene Cornea, MD  lisinopril (PRINIVIL,ZESTRIL) 10 MG tablet Take 1 tablet (10 mg total) by mouth daily. 06/24/16 11/30/19  Mesner, Corene Cornea, MD                                                                                                                                    Past Surgical History Past Surgical History:  Procedure Laterality Date   NO PAST SURGERIES     Family History Family History  Problem Relation Age of Onset   Stroke Other         first-degree relative less than 53   Hypertension Mother    Diabetes Mother    Hypertension Father  Congestive Heart Failure Father     Social History Social History   Tobacco Use   Smoking status: Every Day    Packs/day: 2.00    Years: 23.00    Pack years: 46.00    Types: Cigarettes    Last attempt to quit: 01/14/2014    Years since quitting: 7.1   Smokeless tobacco: Never  Substance Use Topics   Alcohol use: Yes    Alcohol/week: 9.0 standard drinks    Types: 9 Cans of beer per week   Drug use: Yes    Types: Cocaine    Comment: 02/07/2014 "exposed to cocaine in 1996; involuntarily; I almost died; someone did this to me; I didn't do it to myself"   Allergies Patient has no known allergies.  Review of Systems Review of Systems  Constitutional:  Negative for fatigue and fever.  Respiratory:  Negative for cough and shortness of breath.   Cardiovascular:  Positive for chest pain.  Gastrointestinal:  Negative for nausea and vomiting.  All other systems are reviewed and are negative for acute change except as noted in the HPI  Physical Exam Vital Signs  I have reviewed the triage vital signs BP (!) 138/96 (BP Location: Left Arm)   Pulse 81   Temp 98.7 F (37.1 C)  (Oral)   Resp 20   SpO2 100%   Physical Exam Vitals reviewed.  Constitutional:      General: He is not in acute distress.    Appearance: He is well-developed. He is not diaphoretic.  HENT:     Head: Normocephalic and atraumatic.     Nose: Nose normal.  Eyes:     General: No scleral icterus.       Right eye: No discharge.        Left eye: No discharge.     Conjunctiva/sclera: Conjunctivae normal.     Pupils: Pupils are equal, round, and reactive to light.  Cardiovascular:     Rate and Rhythm: Normal rate and regular rhythm.     Heart sounds: No murmur heard.   No friction rub. No gallop.  Pulmonary:     Effort: Pulmonary effort is normal. No respiratory distress.     Breath sounds: Normal breath sounds. No stridor. No rales.  Chest:     Chest wall: No tenderness.  Abdominal:     General: There is no distension.     Palpations: Abdomen is soft.     Tenderness: There is no abdominal tenderness.  Musculoskeletal:        General: No tenderness.     Cervical back: Normal range of motion and neck supple.  Skin:    General: Skin is warm and dry.     Findings: No erythema or rash.  Neurological:     Mental Status: He is alert and oriented to person, place, and time.    ED Results and Treatments Labs (all labs ordered are listed, but only abnormal results are displayed) Labs Reviewed  BASIC METABOLIC PANEL - Abnormal; Notable for the following components:      Result Value   Sodium 130 (*)    CO2 21 (*)    Glucose, Bld 111 (*)    Calcium 8.8 (*)    All other components within normal limits  CBC  TROPONIN I (HIGH SENSITIVITY)  TROPONIN I (HIGH SENSITIVITY)  EKG  EKG Interpretation  Date/Time:  Saturday February 22 2021 03:39:19 EDT Ventricular Rate:  88 PR Interval:  164 QRS Duration: 90 QT Interval:  380 QTC Calculation: 459 R Axis:   39 Text  Interpretation: Normal sinus rhythm Nonspecific T wave abnormality Abnormal ECG No significant change since last tracing Confirmed by Addison Lank [phone]) on 02/22/2021 5:08:33 AM       Radiology DG Chest 2 View  Result Date: 02/22/2021 CLINICAL DATA:  Chest pain EXAM: CHEST - 2 VIEW COMPARISON:  None. FINDINGS: Normal mediastinum and cardiac silhouette. Normal pulmonary vasculature. No evidence of effusion, infiltrate, or pneumothorax. No acute bony abnormality. IMPRESSION: No acute cardiopulmonary process. Electronically Signed   By: Suzy Bouchard M.D.   On: 02/22/2021 05:37    Pertinent labs & imaging results that were available during my care of the patient were reviewed by me and considered in my medical decision making (see MDM for details).  Medications Ordered in ED Medications  alum & mag hydroxide-simeth (MAALOX/MYLANTA) 200-200-20 MG/5ML suspension 30 mL (30 mLs Oral Given 02/22/21 0616)                                                                                                                                     Procedures Procedures  (including critical care time)  Medical Decision Making / ED Course I have reviewed the nursing notes for this encounter and the patient's prior records (if available in EHR or on provided paperwork).  GLENDELL WARTMAN was evaluated in Emergency Department on 02/22/2021 for the symptoms described in the history of present illness. He was evaluated in the context of the global COVID-19 pandemic, which necessitated consideration that the patient might be at risk for infection with the SARS-CoV-2 virus that causes COVID-19. Institutional protocols and algorithms that pertain to the evaluation of patients at risk for COVID-19 are in a state of rapid change based on information released by regulatory bodies including the CDC and federal and state organizations. These policies and algorithms were followed during the patient's care in the ED.      Atypical EKG w/o acute ischemic or evidence of pericarditis. HEART <3. Initial trop negative. Patient needs a delta troponin.   Presentation not classic for either dissection or esophageal perforation. Chest x-ray without evidence suggestive of pneumonia, pneumothorax, pneumomediastinum.  No abnormal contour of the mediastinum to suggest dissection. No evidence of acute injuries. Low suspicion for pulmonary embolism.  Pertinent labs & imaging results that were available during my care of the patient were reviewed by me and considered in my medical decision making:  Patient care turned over to Dr Doren Custard. Patient case and results discussed in detail; please see their note for further ED managment.     Final Clinical Impression(s) / ED Diagnoses Final diagnoses:  None     This chart was dictated using voice recognition software.  Despite best efforts to proofread,  errors can occur which can change the documentation meaning.    Fatima Blank, MD 02/23/21 2117

## 2022-11-18 IMAGING — DX DG CHEST 1V PORT
1 series · 1 of 1 positions shown · non-contrast
Comparison: 08/13/2015

CLINICAL DATA: Chest pain

EXAM:
PORTABLE CHEST 1 VIEW

[chest ap]
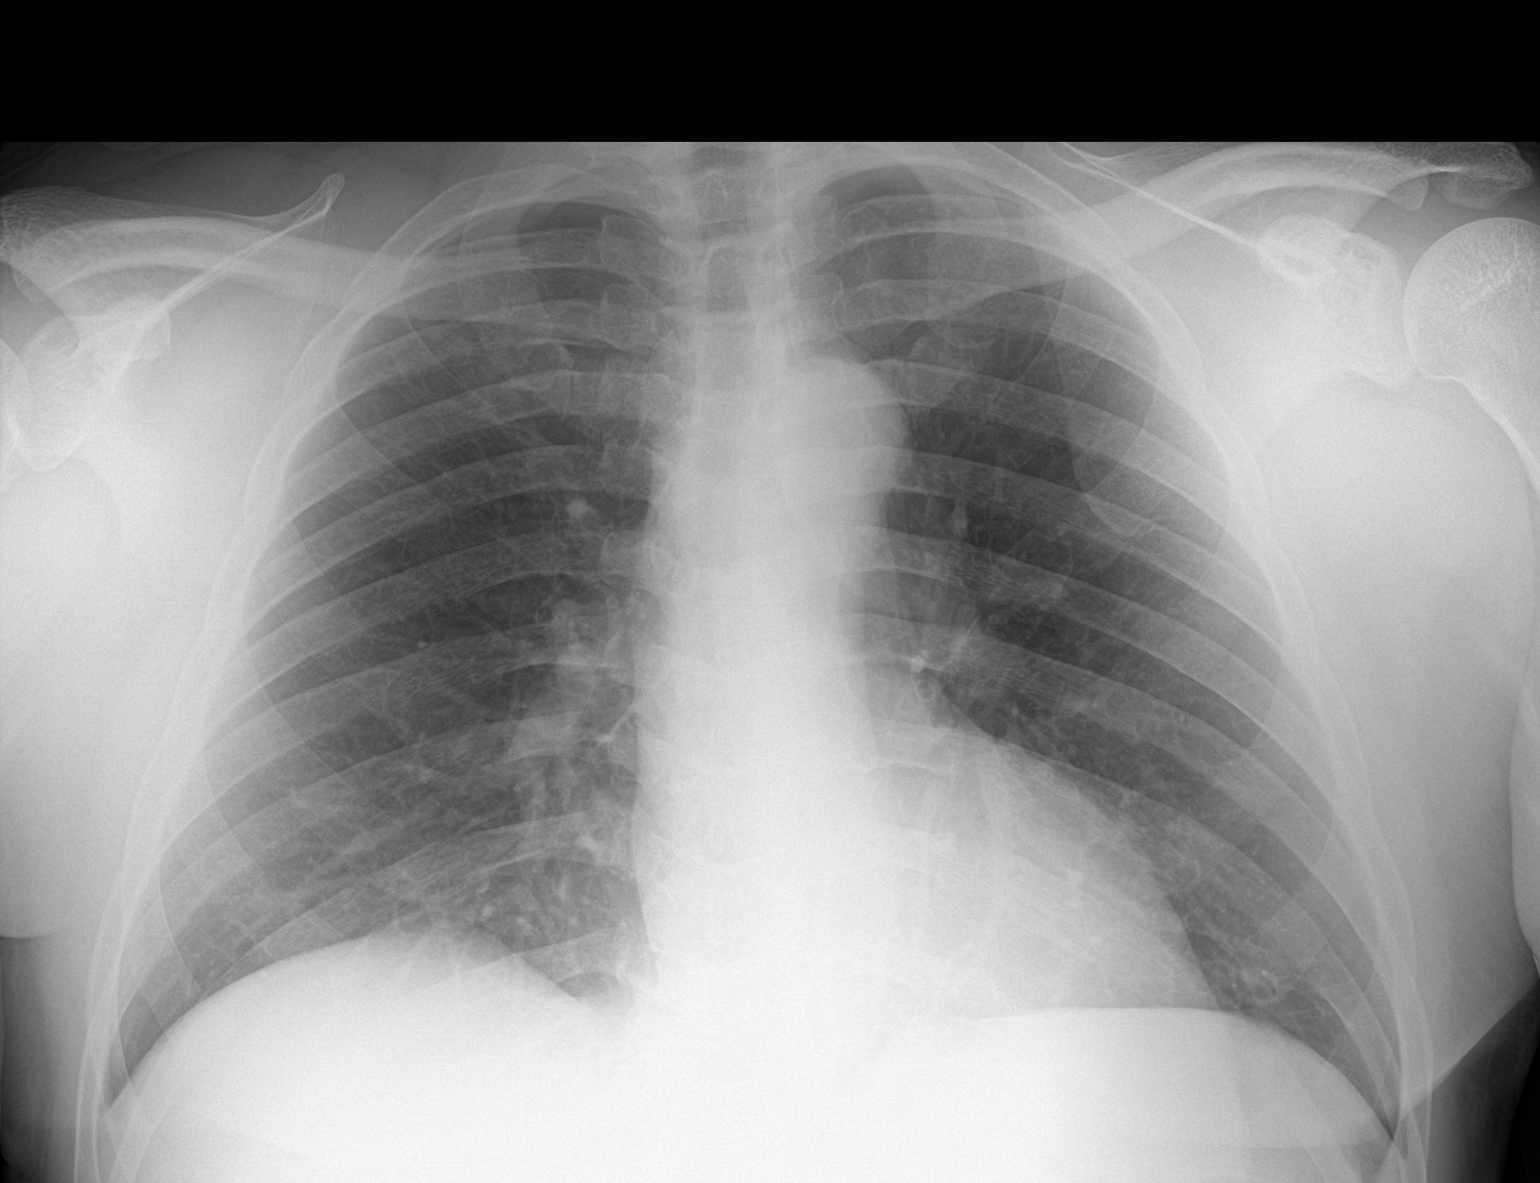

[1 of 1 positions shown; findings below may reference images not displayed]

FINDINGS: The heart size and mediastinal contours are within normal limits.
Both lungs are clear. The visualized skeletal structures are
unremarkable.
IMPRESSION: No acute abnormality of the lungs.

## 2022-12-22 ENCOUNTER — Other Ambulatory Visit: Payer: Self-pay

## 2022-12-22 ENCOUNTER — Encounter (HOSPITAL_COMMUNITY): Payer: Self-pay | Admitting: Emergency Medicine

## 2022-12-22 ENCOUNTER — Ambulatory Visit (HOSPITAL_COMMUNITY)
Admission: EM | Admit: 2022-12-22 | Discharge: 2022-12-22 | Disposition: A | Discharge status: Home / Self Care | Payer: BC Managed Care – PPO | Attending: Family Medicine | Admitting: Family Medicine

## 2022-12-22 DIAGNOSIS — I10 Essential (primary) hypertension: Secondary | ICD-10-CM | POA: Diagnosis not present

## 2022-12-22 MED ORDER — AMLODIPINE BESYLATE 10 MG PO TABS: 10.0000 mg | ORAL_TABLET | Freq: Every day | ORAL | 2 refills | Status: AC

## 2022-12-22 MED ORDER — LISINOPRIL-HYDROCHLOROTHIAZIDE 20-25 MG PO TABS: 1.0000 | ORAL_TABLET | Freq: Every day | ORAL | 2 refills | Status: AC

## 2022-12-22 NOTE — ED Provider Notes (Signed)
[facility]    CSN: [id number] Arrival date & time: 12/22/22  1003      History   Chief Complaint Chief Complaint  Patient presents with   Medication Refill    HPI Glenn Newton is a 52 y.o. male.    Medication Refill  Here for hypertension.  He had lost his PCP in December and then is wanting to change from the current PCP due to some charging practices, that he is not agreeing with.  He is on amlodipine and lisinopril HCT for hypertension.  He is not having any headache or chest pain or edema.  He is about to run out of both medications and would like prescription refills until he finds a new PCP.  Past Medical History:  Diagnosis Date   High cholesterol    Hypertension    Hypertension    Left ventricular hypertrophy    Stroke ([facility]) 04/2008   left thalamus/posterior limb internal capsule and right parietal subcortical [place] matter.   TIA (transient ischemic attack) 02/06/2014    Patient Active Problem List   Diagnosis Date Noted   Hypertensive urgency 08/13/2015   Alcohol use 08/13/2015   TIA (transient ischemic attack) 02/06/2014   Tinea corporis 02/06/2014   Rash and nonspecific skin eruption 01/04/2014   Candidal intertrigo 01/04/2014   NEVUS 10/19/2009   HYPERLIPIDEMIA 04/30/2009   TOBACCO ABUSE 06/20/2008   HYPERTENSION 05/02/2008   CEREBROVASCULAR ACCIDENT, HX OF 05/02/2008    Past Surgical History:  Procedure Laterality Date   NO PAST SURGERIES         Home Medications    Prior to Admission medications   Medication Sig Start Date End Date Taking? Authorizing Provider  amLODipine (NORVASC) 10 MG tablet Take 1 tablet (10 mg total) by mouth daily. 12/22/22   Zenia Resides, MD  aspirin 325 MG tablet Take 1 tablet (325 mg total) by mouth daily. Patient not taking: Reported on 12/22/2022 08/14/15   Maretta Bees, MD  atorvastatin (LIPITOR) 40 MG tablet Take 40 mg by mouth daily. Patient not taking: Reported on 12/22/2022 12/02/20    [provider]  lisinopril-hydrochlorothiazide (ZESTORETIC) 20-25 MG tablet Take 1 tablet by mouth daily. 12/22/22   Jacquis Paxton, Janace Aris, MD  SKYRIZI PEN 150 MG/ML SOAJ Inject 150 mg into the skin every 4 (four) months. 01/16/21   [provider]  hydrochlorothiazide (HYDRODIURIL) 25 MG tablet Take 1 tablet (25 mg total) by mouth daily. 06/24/16 11/30/19  Mesner, Barbara Cower, MD  lisinopril (PRINIVIL,ZESTRIL) 10 MG tablet Take 1 tablet (10 mg total) by mouth daily. 06/24/16 11/30/19  Mesner, Barbara Cower, MD    Family History Family History  Problem Relation Age of Onset   Hypertension Mother    Diabetes Mother    Hypertension Father    Congestive Heart Failure Father    Stroke Other         first-degree relative less than 28    Social History Social History   Tobacco Use   Smoking status: Former    Packs/day: 2.00    Years: 23.00    Additional pack years: 0.00    Total pack years: 46.00    Types: Cigarettes    Quit date: 01/14/2014    Years since quitting: 8.9   Smokeless tobacco: Never  Vaping Use   Vaping Use: Never used  Substance Use Topics   Alcohol use: Yes    Alcohol/week: 9.0 standard drinks of alcohol    Types: 9 Cans of beer  per week   Drug use: Not Currently    Types: Cocaine    Comment: 02/07/2014 "exposed to cocaine in 1996; involuntarily; I almost died; someone did this to me; I didn't do it to myself"     Allergies   Patient has no known allergies.   Review of Systems Review of Systems   Physical Exam Triage Vital Signs ED Triage Vitals  Enc Vitals Group     BP 12/22/22 1042 (!) 138/95     Pulse Rate 12/22/22 1042 76     Resp 12/22/22 1042 20     Temp 12/22/22 1042 98.5 F (36.9 C)     Temp Source 12/22/22 1042 Oral     SpO2 12/22/22 1042 96 %     Weight --      Height --      Head Circumference --      Peak Flow --      Pain Score 12/22/22 1039 0     Pain Loc --      Pain Edu? --      Excl. in GC? --    No data found.  Updated  Vital Signs BP (!) 138/95 (BP Location: Left Arm) Comment (BP Location): large cuff  Pulse 76   Temp 98.5 F (36.9 C) (Oral)   Resp 20   SpO2 96%   Visual Acuity Right Eye Distance:   Left Eye Distance:   Bilateral Distance:    Right Eye Near:   Left Eye Near:    Bilateral Near:     Physical Exam Vitals reviewed.  Constitutional:      General: He is not in acute distress.    Appearance: He is not ill-appearing, toxic-appearing or diaphoretic.  Cardiovascular:     Rate and Rhythm: Normal rate and regular rhythm.     Heart sounds: No murmur heard. Pulmonary:     Effort: Pulmonary effort is normal.     Breath sounds: Normal breath sounds.  Musculoskeletal:     Right lower leg: No edema.     Left lower leg: No edema.  Skin:    Coloration: Skin is not jaundiced or pale.  Neurological:     General: No focal deficit present.     Mental Status: He is alert and oriented to person, place, and time.  Psychiatric:        Behavior: Behavior normal.      UC Treatments / Results  Labs (all labs ordered are listed, but only abnormal results are displayed) Labs Reviewed - No data to display  EKG   Radiology No results found.  Procedures Procedures (including critical care time)  Medications Ordered in UC Medications - No data to display  Initial Impression / Assessment and Plan / UC Course  I have reviewed the triage vital signs and the nursing notes.  Pertinent labs & imaging results that were available during my care of the patient were reviewed by me and considered in my medical decision making (see chart for details).        Amlodipine and lisinopril HCT are sent in for him.  He was given instructions on how to self schedule on the [facility] health website with primary care. Final Clinical Impressions(s) / UC Diagnoses   Final diagnoses:  Essential hypertension, benign     Discharge Instructions      Continue Amlodipine 10 mg--1 daily for blood  pressure  Continue lisinopril HCT 20-25  --1 daily for blood pressure   You can  use the QR code/website at the back of the summary paperwork to schedule yourself a new patient appointment with primary care      ED Prescriptions     Medication Sig Dispense Auth. Provider   lisinopril-hydrochlorothiazide (ZESTORETIC) 20-25 MG tablet Take 1 tablet by mouth daily. 30 tablet Zenia Resides, MD   amLODipine (NORVASC) 10 MG tablet Take 1 tablet (10 mg total) by mouth daily. 30 tablet Jinelle Butchko, Janace Aris, MD      PDMP not reviewed this encounter.   Zenia Resides, MD 12/22/22 1105

## 2022-12-22 NOTE — ED Triage Notes (Signed)
Patient requesting refills of medications.  Does not have a pcp:  Lisinopril -hctz 20-25mg  Amlodipine besylate 10mg   Patient is not out of medication

## 2022-12-22 NOTE — Discharge Instructions (Signed)
Continue Amlodipine 10 mg--1 daily for blood pressure  Continue lisinopril HCT 20-25  --1 daily for blood pressure   You can use the QR code/website at the back of the summary paperwork to schedule yourself a new patient appointment with primary care

## 2023-11-30 ENCOUNTER — Other Ambulatory Visit: Payer: Self-pay

## 2023-11-30 ENCOUNTER — Encounter (HOSPITAL_COMMUNITY): Payer: Self-pay

## 2023-11-30 ENCOUNTER — Emergency Department (HOSPITAL_COMMUNITY)
Admission: EM | Admit: 2023-11-30 | Discharge: 2023-11-30 | Disposition: A | Discharge status: Home / Self Care | Attending: Emergency Medicine | Admitting: Emergency Medicine

## 2023-11-30 DIAGNOSIS — Z7982 Long term (current) use of aspirin: Secondary | ICD-10-CM | POA: Insufficient documentation

## 2023-11-30 DIAGNOSIS — R1032 Left lower quadrant pain: Secondary | ICD-10-CM | POA: Insufficient documentation

## 2023-11-30 DIAGNOSIS — R109 Unspecified abdominal pain: Secondary | ICD-10-CM

## 2023-11-30 LAB — COMPREHENSIVE METABOLIC PANEL WITH GFR
ALT: 25 U/L (ref 0–44)
AST: 32 U/L (ref 15–41)
Albumin: 4.2 g/dL (ref 3.5–5.0)
Alkaline Phosphatase: 81 U/L (ref 38–126)
Anion gap: 10 (ref 5–15)
BUN: 13 mg/dL (ref 6–20)
CO2: 24 mmol/L (ref 22–32)
Calcium: 9.4 mg/dL (ref 8.9–10.3)
Chloride: 102 mmol/L (ref 98–111)
Creatinine, Ser: 1.08 mg/dL (ref 0.61–1.24)
GFR, Estimated: >60 mL/min (ref >60)
Glucose, Bld: 96 mg/dL (ref 70–99)
Potassium: 3.8 mmol/L (ref 3.5–5.1)
Sodium: 136 mmol/L (ref 135–145)
Total Bilirubin: 1 mg/dL (ref 0.0–1.2)
Total Protein: 8.1 g/dL (ref 6.5–8.1)

## 2023-11-30 LAB — CBC WITH DIFFERENTIAL/PLATELET
Abs Immature Granulocytes: 0.01 10*3/uL (ref 0.00–0.07)
Basophils Absolute: 0.1 10*3/uL (ref 0.0–0.1)
Basophils Relative: 1 %
Eosinophils Absolute: 0.3 10*3/uL (ref 0.0–0.5)
Eosinophils Relative: 5 %
HCT: 45.8 % (ref 39.0–52.0)
Hemoglobin: 15.2 g/dL (ref 13.0–17.0)
Immature Granulocytes: 0 %
Lymphocytes Relative: 44 %
Lymphs Abs: 2.5 10*3/uL (ref 0.7–4.0)
MCH: 29.2 pg (ref 26.0–34.0)
MCHC: 33.2 g/dL (ref 30.0–36.0)
MCV: 88.1 fL (ref 80.0–100.0)
Monocytes Absolute: 0.6 10*3/uL (ref 0.1–1.0)
Monocytes Relative: 10 %
Neutro Abs: 2.3 10*3/uL (ref 1.7–7.7)
Neutrophils Relative %: 40 %
Platelets: 282 10*3/uL (ref 150–400)
RBC: 5.2 MIL/uL (ref 4.22–5.81)
RDW: 13.9 % (ref 11.5–15.5)
WBC: 5.8 10*3/uL (ref 4.0–10.5)
nRBC: 0 % (ref 0.0–0.2)

## 2023-11-30 LAB — LIPASE, BLOOD: Lipase: 29 U/L (ref 11–51)

## 2023-11-30 LAB — TROPONIN I (HIGH SENSITIVITY)
Troponin I (High Sensitivity): 5 ng/L (ref <18)
Troponin I (High Sensitivity): 5 ng/L (ref <18)

## 2023-11-30 NOTE — ED Provider Notes (Signed)
 [place] EMERGENCY DEPARTMENT AT [facility] - Closed Provider Note   CSN: [id number] Arrival date & time: 11/30/23  1726     History  Chief Complaint  Patient presents with   Dizziness   Abdominal Pain   Chest Pain    Glenn Newton is a 53 y.o. male.  53 year old male presents with left lower quadrant abdominal discomfort.  Pain is characterized as sharp and on a scale of 1-10 he describes as between 0 and 1.  States it waxes and wanes.  Only last for seconds.  Is not associated with shortness of breath, dizziness, diaphoresis.  No recent URI symptoms.  States he did have left-sided flank pain for quite some time but states that is now improved.  Denies any urinary symptoms.  States he feels at his baseline.  No treatment use prior to arrival       Home Medications Prior to Admission medications   Medication Sig Start Date End Date Taking? Authorizing Provider  amLODipine  (NORVASC ) 10 MG tablet Take 1 tablet (10 mg total) by mouth daily. 12/22/22   Banister, Pamela K, MD  aspirin  325 MG tablet Take 1 tablet (325 mg total) by mouth daily. Patient not taking: Reported on 12/22/2022 08/14/15   Burton Casey, MD  atorvastatin  (LIPITOR) 40 MG tablet Take 40 mg by mouth daily. Patient not taking: Reported on 12/22/2022 12/02/20   [provider]  lisinopril -hydrochlorothiazide  (ZESTORETIC ) 20-25 MG tablet Take 1 tablet by mouth daily. 12/22/22   Banister, Pamela K, MD  SKYRIZI PEN 150 MG/ML SOAJ Inject 150 mg into the skin every 4 (four) months. 01/16/21   [provider]  hydrochlorothiazide  (HYDRODIURIL ) 25 MG tablet Take 1 tablet (25 mg total) by mouth daily. 06/24/16 11/30/19  Mesner, Reymundo Caulk, MD  lisinopril  (PRINIVIL ,ZESTRIL ) 10 MG tablet Take 1 tablet (10 mg total) by mouth daily. 06/24/16 11/30/19  Mesner, Reymundo Caulk, MD      Allergies    Patient has no known allergies.    Review of Systems   Review of Systems  All other systems reviewed and are  negative.   Physical Exam Updated Vital Signs BP (!) 166/112 (BP Location: Left Arm)   Pulse 73   Temp 98.5 F (36.9 C) (Oral)   Resp 18   SpO2 99%  Physical Exam Vitals and nursing note reviewed.  Constitutional:      General: He is not in acute distress.    Appearance: Normal appearance. He is well-developed. He is not toxic-appearing.  HENT:     Head: Normocephalic and atraumatic.  Eyes:     General: Lids are normal.     Conjunctiva/sclera: Conjunctivae normal.     Pupils: Pupils are equal, round, and reactive to light.  Neck:     Thyroid: No thyroid mass.     Trachea: No tracheal deviation.  Cardiovascular:     Rate and Rhythm: Normal rate and regular rhythm.     Heart sounds: Normal heart sounds. No murmur heard.    No gallop.  Pulmonary:     Effort: Pulmonary effort is normal. No respiratory distress.     Breath sounds: Normal breath sounds. No stridor. No decreased breath sounds, wheezing, rhonchi or rales.  Abdominal:     General: There is no distension.     Palpations: Abdomen is soft.     Tenderness: There is no abdominal tenderness. There is no rebound.  Musculoskeletal:        General: No tenderness. Normal range of motion.  Cervical back: Normal range of motion and neck supple.  Skin:    General: Skin is warm and dry.     Findings: No abrasion or rash.  Neurological:     Mental Status: He is alert and oriented to person, place, and time. Mental status is at baseline.     GCS: GCS eye subscore is 4. GCS verbal subscore is 5. GCS motor subscore is 6.     Cranial Nerves: No cranial nerve deficit.     Sensory: No sensory deficit.     Motor: Motor function is intact.  Psychiatric:        Attention and Perception: Attention normal.        Speech: Speech normal.        Behavior: Behavior normal.     ED Results / Procedures / Treatments   Labs (all labs ordered are listed, but only abnormal results are displayed) Labs Reviewed  CBC WITH  DIFFERENTIAL/PLATELET  COMPREHENSIVE METABOLIC PANEL WITH GFR  LIPASE, BLOOD  URINALYSIS, ROUTINE W REFLEX MICROSCOPIC  TROPONIN I (HIGH SENSITIVITY)    EKG EKG Interpretation Date/Time:  Tuesday Nov 30 2023 17:32:40 EDT Ventricular Rate:  74 PR Interval:  170 QRS Duration:  90 QT Interval:  374 QTC Calculation: 415 R Axis:   50  Text Interpretation: Sinus rhythm Borderline repolarization abnormality Confirmed by Lind Repine ([id number]) on 11/30/2023 6:18:44 PM  Radiology No results found.  Procedures Procedures    Medications Ordered in ED Medications - No data to display  ED Course/ Medical Decision Making/ A&P                                 Medical Decision Making Amount and/or Complexity of Data Reviewed Labs: ordered.   Patient's EKG shows sinus rhythm with a rate of 86.  Patient with 2 negative troponins.  Labs otherwise unremarkable.  Patient symptoms were consistent with likely GI etiology as well as ACS.  He has been asymptomatic here and will discharge home        Final Clinical Impression(s) / ED Diagnoses Final diagnoses:  None    Rx / DC Orders ED Discharge Orders     None         Lind Repine, MD 11/30/23 2134

## 2023-11-30 NOTE — ED Triage Notes (Signed)
 Pt came in for dizziness, chest pain, and back pain that radiated to his abdomen. Pt symptoms started about an hour ago but the back pain started months ago.
